# Patient Record
Sex: Male | Born: 1980 | Race: White | Hispanic: No | Marital: Single | Smoking: Never smoker
Health system: Southern US, Community
[De-identification: ages and names within clinical notes are randomized; demographics above are authoritative.]

## PROBLEM LIST (undated history)

## (undated) DIAGNOSIS — Z8709 Personal history of other diseases of the respiratory system: Secondary | ICD-10-CM

---

## 1998-08-09 ENCOUNTER — Emergency Department (HOSPITAL_COMMUNITY): Admission: EM | Admit: 1998-08-09 | Discharge: 1998-08-09 | Payer: Self-pay | Admitting: Emergency Medicine

## 1999-11-07 ENCOUNTER — Encounter: Payer: Self-pay | Admitting: Emergency Medicine

## 1999-11-07 ENCOUNTER — Emergency Department (HOSPITAL_COMMUNITY): Admission: EM | Admit: 1999-11-07 | Discharge: 1999-11-07 | Payer: Self-pay

## 1999-12-02 ENCOUNTER — Emergency Department (HOSPITAL_COMMUNITY): Admission: EM | Admit: 1999-12-02 | Discharge: 1999-12-02 | Payer: Self-pay

## 2004-07-09 ENCOUNTER — Encounter: Payer: Self-pay | Admitting: Surgery

## 2004-07-09 ENCOUNTER — Inpatient Hospital Stay (HOSPITAL_COMMUNITY): Admission: AD | Admit: 2004-07-09 | Discharge: 2004-07-12 | Payer: Self-pay | Admitting: Surgery

## 2004-07-22 ENCOUNTER — Encounter: Admission: RE | Admit: 2004-07-22 | Discharge: 2004-07-22 | Payer: Self-pay | Admitting: Surgery

## 2004-08-05 ENCOUNTER — Encounter: Admission: RE | Admit: 2004-08-05 | Discharge: 2004-08-05 | Payer: Self-pay | Admitting: Surgery

## 2014-12-12 ENCOUNTER — Encounter (HOSPITAL_COMMUNITY): Admission: EM | Disposition: A | Discharge status: Home Health | Payer: Self-pay | Source: Home / Self Care

## 2014-12-12 ENCOUNTER — Emergency Department (HOSPITAL_COMMUNITY): Payer: Self-pay | Admitting: Anesthesiology

## 2014-12-12 ENCOUNTER — Inpatient Hospital Stay (HOSPITAL_COMMUNITY)
Admission: EM | Admit: 2014-12-12 | Discharge: 2014-12-19 | DRG: 339 | Disposition: A | Discharge status: Home Health | Payer: Self-pay | Attending: General Surgery | Admitting: General Surgery

## 2014-12-12 ENCOUNTER — Encounter (HOSPITAL_COMMUNITY): Payer: Self-pay | Admitting: Physical Medicine and Rehabilitation

## 2014-12-12 ENCOUNTER — Emergency Department (HOSPITAL_COMMUNITY): Payer: Self-pay

## 2014-12-12 DIAGNOSIS — N179 Acute kidney failure, unspecified: Secondary | ICD-10-CM | POA: Diagnosis not present

## 2014-12-12 DIAGNOSIS — K3532 Acute appendicitis with perforation and localized peritonitis, without abscess: Secondary | ICD-10-CM | POA: Diagnosis present

## 2014-12-12 DIAGNOSIS — K913 Postprocedural intestinal obstruction: Secondary | ICD-10-CM | POA: Diagnosis present

## 2014-12-12 DIAGNOSIS — R112 Nausea with vomiting, unspecified: Secondary | ICD-10-CM | POA: Diagnosis present

## 2014-12-12 DIAGNOSIS — N289 Disorder of kidney and ureter, unspecified: Secondary | ICD-10-CM

## 2014-12-12 DIAGNOSIS — E860 Dehydration: Secondary | ICD-10-CM | POA: Diagnosis present

## 2014-12-12 DIAGNOSIS — K352 Acute appendicitis with generalized peritonitis: Principal | ICD-10-CM | POA: Diagnosis present

## 2014-12-12 HISTORY — PX: LAPAROTOMY: SHX154

## 2014-12-12 HISTORY — DX: Personal history of other diseases of the respiratory system: Z87.09

## 2014-12-12 HISTORY — PX: LAPAROSCOPY: SHX197

## 2014-12-12 HISTORY — PX: APPENDECTOMY: SHX54

## 2014-12-12 LAB — CBC WITH DIFFERENTIAL/PLATELET
BASOS ABS: 0 10*3/uL (ref 0.0–0.1)
BASOS PCT: 0 % (ref 0–1)
EOS PCT: 0 % (ref 0–5)
Eosinophils Absolute: 0 10*3/uL (ref 0.0–0.7)
HEMATOCRIT: 42.6 % (ref 39.0–52.0)
Hemoglobin: 14.9 g/dL (ref 13.0–17.0)
Lymphocytes Relative: 6 % — ABNORMAL LOW (ref 12–46)
Lymphs Abs: 1 10*3/uL (ref 0.7–4.0)
MCH: 32.1 pg (ref 26.0–34.0)
MCHC: 35 g/dL (ref 30.0–36.0)
MCV: 91.8 fL (ref 78.0–100.0)
MONO ABS: 1.1 10*3/uL — AB (ref 0.1–1.0)
Monocytes Relative: 6 % (ref 3–12)
NEUTROS ABS: 14.7 10*3/uL — AB (ref 1.7–7.7)
Neutrophils Relative %: 88 % — ABNORMAL HIGH (ref 43–77)
PLATELETS: 238 10*3/uL (ref 150–400)
RBC: 4.64 MIL/uL (ref 4.22–5.81)
RDW: 12.4 % (ref 11.5–15.5)
WBC: 16.8 10*3/uL — ABNORMAL HIGH (ref 4.0–10.5)

## 2014-12-12 LAB — COMPREHENSIVE METABOLIC PANEL
ALK PHOS: 51 U/L (ref 38–126)
ALT: 12 U/L — AB (ref 17–63)
AST: 16 U/L (ref 15–41)
Albumin: 3.5 g/dL (ref 3.5–5.0)
Anion gap: 15 (ref 5–15)
BILIRUBIN TOTAL: 2.2 mg/dL — AB (ref 0.3–1.2)
BUN: 28 mg/dL — ABNORMAL HIGH (ref 6–20)
CHLORIDE: 92 mmol/L — AB (ref 101–111)
CO2: 23 mmol/L (ref 22–32)
CREATININE: 2.3 mg/dL — AB (ref 0.61–1.24)
Calcium: 9.2 mg/dL (ref 8.9–10.3)
GFR calc Af Amer: 41 mL/min — ABNORMAL LOW (ref >60)
GFR, EST NON AFRICAN AMERICAN: 35 mL/min — AB (ref >60)
Glucose, Bld: 120 mg/dL — ABNORMAL HIGH (ref 65–99)
Potassium: 4 mmol/L (ref 3.5–5.1)
Sodium: 130 mmol/L — ABNORMAL LOW (ref 135–145)
Total Protein: 8.6 g/dL — ABNORMAL HIGH (ref 6.5–8.1)

## 2014-12-12 LAB — MRSA PCR SCREENING: MRSA BY PCR: NEGATIVE

## 2014-12-12 LAB — LIPASE, BLOOD: Lipase: 11 U/L — ABNORMAL LOW (ref 22–51)

## 2014-12-12 SURGERY — LAPAROSCOPY, DIAGNOSTIC
Anesthesia: General | Site: Abdomen

## 2014-12-12 MED ORDER — SODIUM CHLORIDE 0.9 % IV BOLUS (SEPSIS)
1000.0000 mL | Freq: Once | INTRAVENOUS | Status: AC
  Administered 2014-12-12: 1000 mL via INTRAVENOUS

## 2014-12-12 MED ORDER — ACETAMINOPHEN 10 MG/ML IV SOLN
INTRAVENOUS | Status: AC
  Filled 2014-12-12: qty 100

## 2014-12-12 MED ORDER — FENTANYL CITRATE (PF) 250 MCG/5ML IJ SOLN
INTRAMUSCULAR | Status: AC
  Filled 2014-12-12: qty 5

## 2014-12-12 MED ORDER — PROMETHAZINE HCL 25 MG/ML IJ SOLN: 6.2500 mg | INTRAMUSCULAR | Status: DC | PRN

## 2014-12-12 MED ORDER — NALOXONE HCL 0.4 MG/ML IJ SOLN: 0.4000 mg | INTRAMUSCULAR | Status: DC | PRN

## 2014-12-12 MED ORDER — SODIUM CHLORIDE 0.9 % IV SOLN
INTRAVENOUS | Status: DC | PRN
Start: 2014-12-12 — End: 2014-12-12
  Administered 2014-12-12: 16:00:00 via INTRAVENOUS

## 2014-12-12 MED ORDER — BUPIVACAINE HCL 0.25 % IJ SOLN
INTRAMUSCULAR | Status: DC | PRN
  Administered 2014-12-12: 3.5 mL

## 2014-12-12 MED ORDER — NEOSTIGMINE METHYLSULFATE 10 MG/10ML IV SOLN
INTRAVENOUS | Status: DC | PRN
  Administered 2014-12-12: 5 mg via INTRAVENOUS

## 2014-12-12 MED ORDER — IOHEXOL 300 MG/ML  SOLN
25.0000 mL | Freq: Once | INTRAMUSCULAR | Status: AC | PRN
  Administered 2014-12-12: 25 mL via ORAL

## 2014-12-12 MED ORDER — 0.9 % SODIUM CHLORIDE (POUR BTL) OPTIME
TOPICAL | Status: DC | PRN
  Administered 2014-12-12 (×5): 1000 mL

## 2014-12-12 MED ORDER — ACETAMINOPHEN 10 MG/ML IV SOLN
INTRAVENOUS | Status: DC | PRN
  Administered 2014-12-12: 1000 mg via INTRAVENOUS

## 2014-12-12 MED ORDER — NEOSTIGMINE METHYLSULFATE 10 MG/10ML IV SOLN
INTRAVENOUS | Status: AC
  Filled 2014-12-12: qty 1

## 2014-12-12 MED ORDER — ONDANSETRON HCL 4 MG/2ML IJ SOLN
4.0000 mg | Freq: Once | INTRAMUSCULAR | Status: AC
  Administered 2014-12-12: 4 mg via INTRAVENOUS
  Filled 2014-12-12: qty 2

## 2014-12-12 MED ORDER — LACTATED RINGERS IV SOLN
INTRAVENOUS | Status: DC
  Administered 2014-12-12: 50 mL/h via INTRAVENOUS

## 2014-12-12 MED ORDER — MORPHINE SULFATE 4 MG/ML IJ SOLN
4.0000 mg | Freq: Once | INTRAMUSCULAR | Status: AC
  Administered 2014-12-12: 4 mg via INTRAVENOUS
  Filled 2014-12-12: qty 1

## 2014-12-12 MED ORDER — SUCCINYLCHOLINE CHLORIDE 20 MG/ML IJ SOLN
INTRAMUSCULAR | Status: DC | PRN
  Administered 2014-12-12: 40 mg via INTRAVENOUS
  Administered 2014-12-12: 120 mg via INTRAVENOUS

## 2014-12-12 MED ORDER — GLYCOPYRROLATE 0.2 MG/ML IJ SOLN
INTRAMUSCULAR | Status: DC | PRN
  Administered 2014-12-12: 1 mg via INTRAVENOUS

## 2014-12-12 MED ORDER — ONDANSETRON HCL 4 MG/2ML IJ SOLN
INTRAMUSCULAR | Status: DC | PRN
  Administered 2014-12-12: 4 mg via INTRAVENOUS

## 2014-12-12 MED ORDER — LIDOCAINE HCL (CARDIAC) 20 MG/ML IV SOLN
INTRAVENOUS | Status: DC | PRN
  Administered 2014-12-12: 100 mg via INTRAVENOUS

## 2014-12-12 MED ORDER — FENTANYL CITRATE (PF) 100 MCG/2ML IJ SOLN
INTRAMUSCULAR | Status: DC | PRN
  Administered 2014-12-12: 50 ug via INTRAVENOUS
  Administered 2014-12-12: 150 ug via INTRAVENOUS
  Administered 2014-12-12 (×2): 50 ug via INTRAVENOUS

## 2014-12-12 MED ORDER — MIDAZOLAM HCL 2 MG/2ML IJ SOLN
INTRAMUSCULAR | Status: AC
  Filled 2014-12-12: qty 2

## 2014-12-12 MED ORDER — ONDANSETRON HCL 4 MG/2ML IJ SOLN: 4.0000 mg | Freq: Four times a day (QID) | INTRAMUSCULAR | Status: DC | PRN

## 2014-12-12 MED ORDER — PANTOPRAZOLE SODIUM 40 MG IV SOLR
40.0000 mg | INTRAVENOUS | Status: DC
Start: 2014-12-12 — End: 2014-12-15
  Administered 2014-12-12 – 2014-12-14 (×3): 40 mg via INTRAVENOUS
  Filled 2014-12-12 (×3): qty 40

## 2014-12-12 MED ORDER — IOHEXOL 300 MG/ML  SOLN
100.0000 mL | Freq: Once | INTRAMUSCULAR | Status: AC | PRN
  Administered 2014-12-12: 100 mL via INTRAVENOUS

## 2014-12-12 MED ORDER — MIDAZOLAM HCL 5 MG/5ML IJ SOLN
INTRAMUSCULAR | Status: DC | PRN
  Administered 2014-12-12: 2 mg via INTRAVENOUS

## 2014-12-12 MED ORDER — OXYCODONE HCL 5 MG PO TABS: 5.0000 mg | ORAL_TABLET | Freq: Once | ORAL | Status: DC | PRN

## 2014-12-12 MED ORDER — HYDROMORPHONE 0.3 MG/ML IV SOLN
INTRAVENOUS | Status: DC
  Administered 2014-12-12: 1.2 mg via INTRAVENOUS
  Administered 2014-12-13: 0.9 mg via INTRAVENOUS
  Administered 2014-12-13: 0.6 mg via INTRAVENOUS
  Administered 2014-12-13: 0.3 mg via INTRAVENOUS
  Administered 2014-12-13: 1.06 mg via INTRAVENOUS
  Administered 2014-12-13: 1.5 mg via INTRAVENOUS
  Administered 2014-12-13: 0.6 mg via INTRAVENOUS
  Administered 2014-12-13: 2.7 mg via INTRAVENOUS
  Administered 2014-12-14: 2.1 mg via INTRAVENOUS
  Administered 2014-12-14: 0.78 mg via INTRAVENOUS
  Administered 2014-12-14 (×2): 0.6 mg via INTRAVENOUS
  Administered 2014-12-14: 1.8 mg via INTRAVENOUS
  Administered 2014-12-15 (×2): 0.6 mg via INTRAVENOUS
  Administered 2014-12-15: 0.3 mg via INTRAVENOUS
  Administered 2014-12-16 (×2): 0.6 mg via INTRAVENOUS
  Administered 2014-12-16: 0.9 mg via INTRAVENOUS
  Administered 2014-12-16: 0.6 mg via INTRAVENOUS
  Administered 2014-12-16 – 2014-12-17 (×2): 0.9 mg via INTRAVENOUS
  Administered 2014-12-17: 2.1 mg via INTRAVENOUS
  Administered 2014-12-17 – 2014-12-18 (×2): 0.9 mg via INTRAVENOUS
  Filled 2014-12-12 (×3): qty 25

## 2014-12-12 MED ORDER — DIPHENHYDRAMINE HCL 50 MG/ML IJ SOLN: 12.5000 mg | Freq: Four times a day (QID) | INTRAMUSCULAR | Status: DC | PRN

## 2014-12-12 MED ORDER — HYDROMORPHONE 0.3 MG/ML IV SOLN
INTRAVENOUS | Status: AC
  Administered 2014-12-12: 17:00:00
  Filled 2014-12-12: qty 25

## 2014-12-12 MED ORDER — SODIUM CHLORIDE 0.9 % IJ SOLN: 9.0000 mL | INTRAMUSCULAR | Status: DC | PRN

## 2014-12-12 MED ORDER — ENOXAPARIN SODIUM 40 MG/0.4ML ~~LOC~~ SOLN
40.0000 mg | SUBCUTANEOUS | Status: DC
  Administered 2014-12-13 – 2014-12-18 (×6): 40 mg via SUBCUTANEOUS
  Filled 2014-12-12 (×7): qty 0.4

## 2014-12-12 MED ORDER — LACTATED RINGERS IV SOLN
INTRAVENOUS | Status: DC | PRN
  Administered 2014-12-12: 15:00:00 via INTRAVENOUS

## 2014-12-12 MED ORDER — ONDANSETRON HCL 4 MG/2ML IJ SOLN
4.0000 mg | Freq: Four times a day (QID) | INTRAMUSCULAR | Status: DC | PRN
  Administered 2014-12-13 – 2014-12-16 (×5): 4 mg via INTRAVENOUS
  Filled 2014-12-12 (×6): qty 2

## 2014-12-12 MED ORDER — ONDANSETRON HCL 4 MG PO TABS: 4.0000 mg | ORAL_TABLET | Freq: Four times a day (QID) | ORAL | Status: DC | PRN

## 2014-12-12 MED ORDER — GLYCOPYRROLATE 0.2 MG/ML IJ SOLN
INTRAMUSCULAR | Status: AC
  Filled 2014-12-12: qty 5

## 2014-12-12 MED ORDER — ACETAMINOPHEN 500 MG PO TABS
1000.0000 mg | ORAL_TABLET | Freq: Four times a day (QID) | ORAL | Status: AC
  Filled 2014-12-12: qty 2

## 2014-12-12 MED ORDER — SODIUM CHLORIDE 0.9 % IR SOLN
Status: DC | PRN
  Administered 2014-12-12: 1000 mL

## 2014-12-12 MED ORDER — PIPERACILLIN-TAZOBACTAM 3.375 G IVPB
3.3750 g | Freq: Three times a day (TID) | INTRAVENOUS | Status: DC
  Administered 2014-12-12 – 2014-12-18 (×17): 3.375 g via INTRAVENOUS
  Filled 2014-12-12 (×20): qty 50

## 2014-12-12 MED ORDER — KCL IN DEXTROSE-NACL 20-5-0.45 MEQ/L-%-% IV SOLN
INTRAVENOUS | Status: DC
  Administered 2014-12-12 – 2014-12-18 (×9): via INTRAVENOUS
  Filled 2014-12-12 (×18): qty 1000

## 2014-12-12 MED ORDER — HYDROMORPHONE HCL 1 MG/ML IJ SOLN
INTRAMUSCULAR | Status: AC
  Filled 2014-12-12: qty 1

## 2014-12-12 MED ORDER — PIPERACILLIN-TAZOBACTAM 3.375 G IVPB
3.3750 g | Freq: Once | INTRAVENOUS | Status: AC
  Administered 2014-12-12: 3.375 g via INTRAVENOUS
  Filled 2014-12-12: qty 50

## 2014-12-12 MED ORDER — PROPOFOL 10 MG/ML IV BOLUS
INTRAVENOUS | Status: AC
  Filled 2014-12-12: qty 20

## 2014-12-12 MED ORDER — BUPIVACAINE HCL (PF) 0.25 % IJ SOLN
INTRAMUSCULAR | Status: AC
  Filled 2014-12-12: qty 30

## 2014-12-12 MED ORDER — ROCURONIUM BROMIDE 100 MG/10ML IV SOLN
INTRAVENOUS | Status: DC | PRN
  Administered 2014-12-12: 40 mg via INTRAVENOUS
  Administered 2014-12-12: 50 mg via INTRAVENOUS

## 2014-12-12 MED ORDER — OXYCODONE HCL 5 MG/5ML PO SOLN: 5.0000 mg | Freq: Once | ORAL | Status: DC | PRN

## 2014-12-12 MED ORDER — HYDROMORPHONE HCL 1 MG/ML IJ SOLN: 0.2500 mg | INTRAMUSCULAR | Status: DC | PRN

## 2014-12-12 MED ORDER — DIPHENHYDRAMINE HCL 12.5 MG/5ML PO ELIX
12.5000 mg | ORAL_SOLUTION | Freq: Four times a day (QID) | ORAL | Status: DC | PRN
  Filled 2014-12-12: qty 5

## 2014-12-12 MED ORDER — PROPOFOL 10 MG/ML IV BOLUS
INTRAVENOUS | Status: DC | PRN
  Administered 2014-12-12: 200 mg via INTRAVENOUS

## 2014-12-12 SURGICAL SUPPLY — 59 items
APPLIER CLIP 5 13 M/L LIGAMAX5 (MISCELLANEOUS)
BENZOIN TINCTURE PRP APPL 2/3 (GAUZE/BANDAGES/DRESSINGS) ×3 IMPLANT
BLADE SURG 10 STRL SS (BLADE) ×3 IMPLANT
BLADE SURG ROTATE 9660 (MISCELLANEOUS) IMPLANT
BNDG GAUZE ELAST 4 BULKY (GAUZE/BANDAGES/DRESSINGS) ×3 IMPLANT
CANISTER SUCTION 2500CC (MISCELLANEOUS) ×3 IMPLANT
CHLORAPREP W/TINT 26ML (MISCELLANEOUS) ×3 IMPLANT
CLIP APPLIE 5 13 M/L LIGAMAX5 (MISCELLANEOUS) IMPLANT
COVER SURGICAL LIGHT HANDLE (MISCELLANEOUS) ×3 IMPLANT
COVER TRANSDUCER ULTRASND (DRAPES) ×3 IMPLANT
DEVICE TROCAR PUNCTURE CLOSURE (ENDOMECHANICALS) ×3 IMPLANT
DRAIN CHANNEL 19F RND (DRAIN) ×3 IMPLANT
DRSG PAD ABDOMINAL 8X10 ST (GAUZE/BANDAGES/DRESSINGS) ×3 IMPLANT
ELECT CAUTERY BLADE 6.4 (BLADE) ×3 IMPLANT
ELECT REM PT RETURN 9FT ADLT (ELECTROSURGICAL) ×3
ELECTRODE REM PT RTRN 9FT ADLT (ELECTROSURGICAL) ×2 IMPLANT
ENDOLOOP SUT PDS II  0 18 (SUTURE) ×3
ENDOLOOP SUT PDS II 0 18 (SUTURE) ×6 IMPLANT
EVACUATOR SILICONE 100CC (DRAIN) ×3 IMPLANT
GLOVE BIO SURGEON STRL SZ7.5 (GLOVE) ×9 IMPLANT
GLOVE BIOGEL PI IND STRL 7.0 (GLOVE) ×6 IMPLANT
GLOVE BIOGEL PI INDICATOR 7.0 (GLOVE) ×3
GLOVE SURG SS PI 7.0 STRL IVOR (GLOVE) ×9 IMPLANT
GOWN STRL REUS W/ TWL LRG LVL3 (GOWN DISPOSABLE) ×4 IMPLANT
GOWN STRL REUS W/ TWL XL LVL3 (GOWN DISPOSABLE) ×2 IMPLANT
GOWN STRL REUS W/TWL LRG LVL3 (GOWN DISPOSABLE) ×2
GOWN STRL REUS W/TWL XL LVL3 (GOWN DISPOSABLE) ×1
KIT BASIN OR (CUSTOM PROCEDURE TRAY) ×3 IMPLANT
KIT ROOM TURNOVER OR (KITS) ×3 IMPLANT
NEEDLE INSUFFLATION 14GA 120MM (NEEDLE) ×3 IMPLANT
NS IRRIG 1000ML POUR BTL (IV SOLUTION) ×15 IMPLANT
PAD ABD 8X10 STRL (GAUZE/BANDAGES/DRESSINGS) ×3 IMPLANT
PAD ARMBOARD 7.5X6 YLW CONV (MISCELLANEOUS) ×6 IMPLANT
PENCIL BUTTON HOLSTER BLD 10FT (ELECTRODE) ×3 IMPLANT
SCISSORS LAP 5X35 DISP (ENDOMECHANICALS) ×3 IMPLANT
SET IRRIG TUBING LAPAROSCOPIC (IRRIGATION / IRRIGATOR) ×3 IMPLANT
SLEEVE ENDOPATH XCEL 5M (ENDOMECHANICALS) ×3 IMPLANT
SPECIMEN JAR SMALL (MISCELLANEOUS) ×3 IMPLANT
SPONGE GAUZE 4X4 12PLY STER LF (GAUZE/BANDAGES/DRESSINGS) ×3 IMPLANT
SPONGE LAP 18X18 X RAY DECT (DISPOSABLE) ×3 IMPLANT
STAPLER VISISTAT 35W (STAPLE) ×3 IMPLANT
SUCTION POOLE TIP (SUCTIONS) ×3 IMPLANT
SUT ETHILON 2 0 FS 18 (SUTURE) ×3 IMPLANT
SUT MNCRL AB 3-0 PS2 18 (SUTURE) ×3 IMPLANT
SUT PDS AB 1 TP1 54 (SUTURE) ×6 IMPLANT
SUT SILK 2 0 (SUTURE) ×1
SUT SILK 2 0 SH (SUTURE) ×3 IMPLANT
SUT SILK 2-0 18XBRD TIE 12 (SUTURE) ×2 IMPLANT
SUT VICRYL AB 2 0 TIES (SUTURE) ×3 IMPLANT
TAPE CLOTH SURG 6X10 WHT LF (GAUZE/BANDAGES/DRESSINGS) ×3 IMPLANT
TOWEL OR 17X24 6PK STRL BLUE (TOWEL DISPOSABLE) ×3 IMPLANT
TOWEL OR 17X26 10 PK STRL BLUE (TOWEL DISPOSABLE) IMPLANT
TRAY FOLEY CATH 16FR SILVER (SET/KITS/TRAYS/PACK) ×3 IMPLANT
TRAY LAPAROSCOPIC MC (CUSTOM PROCEDURE TRAY) ×3 IMPLANT
TROCAR XCEL NON-BLD 11X100MML (ENDOMECHANICALS) ×3 IMPLANT
TROCAR XCEL NON-BLD 5MMX100MML (ENDOMECHANICALS) ×3 IMPLANT
TUBE CONNECTING 20X1/4 (TUBING) ×3 IMPLANT
TUBING INSUFFLATION (TUBING) ×3 IMPLANT
YANKAUER SUCT BULB TIP NO VENT (SUCTIONS) ×3 IMPLANT

## 2014-12-12 NOTE — Anesthesia Procedure Notes (Signed)
Procedure Name: Intubation Date/Time: 12/12/2014 3:19 PM Performed by: Carmela RimaMARTINELLI, Frank Davis Pre-anesthesia Checklist: Patient being monitored, Suction available, Emergency Drugs available, Timeout performed and Patient identified Patient Re-evaluated:Patient Re-evaluated prior to inductionOxygen Delivery Method: Circle system utilized Preoxygenation: Pre-oxygenation with 100% oxygen Intubation Type: IV induction, Rapid sequence and Cricoid Pressure applied Laryngoscope Size: Mac and 4 Grade View: Grade III Tube type: Oral Tube size: 7.5 mm Number of attempts: 1 Placement Confirmation: positive ETCO2,  ETT inserted through vocal cords under direct vision and breath sounds checked- equal and bilateral Secured at: 24 cm Tube secured with: Tape Dental Injury: Teeth and Oropharynx as per pre-operative assessment  Difficulty Due To: Difficulty was unanticipated

## 2014-12-12 NOTE — ED Notes (Signed)
Pt presents to department for evaluation of generalized abdominal pain, nausea, vomiting and diarrhea. Ongoing since Saturday. 10/10 pain upon arrival to ED.

## 2014-12-12 NOTE — Anesthesia Preprocedure Evaluation (Addendum)
Anesthesia Evaluation  Patient identified by MRN, date of birth, ID band Patient awake    Reviewed: Allergy & Precautions, NPO status , Patient's Chart, lab work & pertinent test results  Airway Mallampati: III  TM Distance: >3 FB Neck ROM: full  Mouth opening: Limited Mouth Opening  Dental  (+) Teeth Intact, Dental Advidsory Given   Pulmonary neg pulmonary ROS,  breath sounds clear to auscultation        Cardiovascular negative cardio ROS  Rhythm:regular Rate:Normal     Neuro/Psych negative neurological ROS     GI/Hepatic Neg liver ROS, Acute perforated appendicitis   Endo/Other  negative endocrine ROS  Renal/GU ARFRenal disease     Musculoskeletal   Abdominal   Peds  Hematology negative hematology ROS (+)   Anesthesia Other Findings   Reproductive/Obstetrics                           Anesthesia Physical Anesthesia Plan  ASA: II and emergent  Anesthesia Plan: General   Post-op Pain Management:    Induction: Intravenous  Airway Management Planned: Oral ETT  Additional Equipment:   Intra-op Plan:   Post-operative Plan: Extubation in OR  Informed Consent: I have reviewed the patients History and Physical, chart, labs and discussed the procedure including the risks, benefits and alternatives for the proposed anesthesia with the patient or authorized representative who has indicated his/her understanding and acceptance.   Dental advisory given and Dental Advisory Given  Plan Discussed with: CRNA, Anesthesiologist and Surgeon  Anesthesia Plan Comments:        Anesthesia Quick Evaluation

## 2014-12-12 NOTE — H&P (Signed)
Frank Davis 24-Aug-1980  [id number].   Chief Complaint/Reason for Consult: abdominal pain HPI: This is a pleasant otherwise healthy 34 yo white male who began having abdominal pain about 4 days ago.  He's been trying to tough it out at home, but his pain has persisted and continued to worsen.  He has had nausea/vomiting/diarrhea at home.  He has had subjective fevers as well.  He denies chest pain, but admits to difficulty breathing secondary to abdominal pain.  He states that his urine has been darker and he has not been voiding as much as usual.  He finally came to the [facility] today where he had a CT scan that revealed acute perforated appendicitis with multiple intra-abdominal fluid collections and a small amount of pneumoperitoneum, focally, in the RLQ.  His WBC is 16.8K, Cr 2.30, and pulse 120-130s.  We have been asked to see the patient for admission.  ROS : Please see HPI, otherwise negative  History reviewed. No pertinent family history.  Past Medical History  Diagnosis Date  . H/O pneumothorax     History reviewed. No pertinent past surgical history.  Social History:  reports that he has never smoked. He does not have any smokeless tobacco history on file. He reports that he drinks alcohol. He reports that he does not use illicit drugs.  Allergies: No Known Allergies   (Not in a hospital admission)  Blood pressure 118/74, pulse 136, temperature 101.2 F (38.4 C), temperature source Oral, resp. rate 18, height 6' 1"  (1.854 m), weight 95.255 kg (210 lb), SpO2 97 %. Physical Exam: General: pleasant, WD, WN white male who is laying in bed in NAD HEENT: head is normocephalic, atraumatic.  Sclera are noninjected.  PERRL.  Ears and nose without any masses or lesions.  Mouth is pink, but dry Heart: regular rhythm, but tachycardic.  Normal s1,s2. No obvious murmurs, gallops, or rubs noted.  Palpable radial and pedal pulses bilaterally Lungs: CTAB, no wheezes, rhonchi, or rales noted.   Respiratory effort nonlabored Abd: some distention, diffuse tenderness with guarding and peritoneal signs throughout, but greatest in RLQ, hypoactive BS, no masses, hernias, or organomegaly MS: all 4 extremities are symmetrical with no cyanosis, clubbing, or edema. Skin: warm, but clammy. no masses, lesions, or rashes Psych: A&Ox3 with an appropriate affect.    Results for orders placed or performed during the hospital encounter of 12/12/14 (from the past 48 hour(s))  CBC with Differential     Status: Abnormal   Collection Time: 12/12/14 12:50 PM  Result Value Ref Range   WBC 16.8 (H) 4.0 - 10.5 K/uL   RBC 4.64 4.22 - 5.81 MIL/uL   Hemoglobin 14.9 13.0 - 17.0 g/dL   HCT 42.6 39.0 - 52.0 %   MCV 91.8 78.0 - 100.0 fL   MCH 32.1 26.0 - 34.0 pg   MCHC 35.0 30.0 - 36.0 g/dL   RDW 12.4 11.5 - 15.5 %   Platelets 238 150 - 400 K/uL   Neutrophils Relative % 88 (H) 43 - 77 %   Neutro Abs 14.7 (H) 1.7 - 7.7 K/uL   Lymphocytes Relative 6 (L) 12 - 46 %   Lymphs Abs 1.0 0.7 - 4.0 K/uL   Monocytes Relative 6 3 - 12 %   Monocytes Absolute 1.1 (H) 0.1 - 1.0 K/uL   Eosinophils Relative 0 0 - 5 %   Eosinophils Absolute 0.0 0.0 - 0.7 K/uL   Basophils Relative 0 0 - 1 %   Basophils Absolute  0.0 0.0 - 0.1 K/uL  Comprehensive metabolic panel     Status: Abnormal   Collection Time: 12/12/14 12:50 PM  Result Value Ref Range   Sodium 130 (L) 135 - 145 mmol/L   Potassium 4.0 3.5 - 5.1 mmol/L   Chloride 92 (L) 101 - 111 mmol/L   CO2 23 22 - 32 mmol/L   Glucose, Bld 120 (H) 65 - 99 mg/dL   BUN 28 (H) 6 - 20 mg/dL   Creatinine, Ser 2.30 (H) 0.61 - 1.24 mg/dL   Calcium 9.2 8.9 - 10.3 mg/dL   Total Protein 8.6 (H) 6.5 - 8.1 g/dL   Albumin 3.5 3.5 - 5.0 g/dL   AST 16 15 - 41 U/L   ALT 12 (L) 17 - 63 U/L   Alkaline Phosphatase 51 38 - 126 U/L   Total Bilirubin 2.2 (H) 0.3 - 1.2 mg/dL   GFR calc non Af Amer 35 (L) >60 mL/min   GFR calc Af Amer 41 (L) >60 mL/min    Comment: (NOTE) The eGFR has been  calculated using the CKD EPI equation. This calculation has not been validated in all clinical situations. eGFR's persistently <60 mL/min signify possible Chronic Kidney Disease.    Anion gap 15 5 - 15  Lipase, blood     Status: Abnormal   Collection Time: 12/12/14 12:50 PM  Result Value Ref Range   Lipase 11 (L) 22 - 51 U/L   Ct Abdomen Pelvis W Contrast  12/12/2014   CLINICAL DATA:  Generalized abdominal pain for 4 days with nausea, vomiting, diarrhea, and fever.  EXAM: CT ABDOMEN AND PELVIS WITH CONTRAST  TECHNIQUE: Multidetector CT imaging of the abdomen and pelvis was performed using the standard protocol following bolus administration of intravenous contrast.  CONTRAST:  157m OMNIPAQUE IOHEXOL 300 MG/ML  SOLN  COMPARISON:  None.  FINDINGS: Motion artifact and subsegmental atelectasis are noted in the lung bases. Oral contrast is present in the visualized distal thoracic esophagus and may represent esophageal dysmotility or gastroesophageal reflux.  No focal liver lesion is identified. The gallbladder, spleen, adrenal glands, right kidney, and pancreas are unremarkable. There is a 10 mm soft tissue density lesion projecting posteriorly from the lower pole of the left kidney, too small to fully characterize.  There is prominent inflammatory stranding and a small amount of free fluid in the right lower quadrant and pelvis. The appendix is located centrally within this region of inflammation and is dilated, measuring 12 mm in diameter, and demonstrates a hyperenhancing wall. There is a small amount of intraperitoneal free air in the lower abdomen and pelvis, the vast majority of which is along the superior aspect of the appendix.  There is moderate wall thickening and hyper enhancement involving the distal ileum with a 3.2 x 2.3 cm gas and fluid collection in the pelvis abutting the inferior aspect of the distal ileum (series 2, image 90 and series 6, image 85). A smaller loculated gas and fluid  collection in the anterior left pelvis measures up to 9 mm in thickness and extends over a length of approximately 5 cm (series 2, image 90). A loculated rim enhancing fluid collection more posteriorly in the pelvis located between the bladder and rectum measures 3.2 x 2.1 cm (series 2, image 98). There is mild dilatation of multiple small bowel loops up to 3.6 cm in diameter. The colon is decompressed. There is mild wall thickening of the sigmoid colon.  Small right mid and lower abdominal mesenteric lymph  nodes measure up to 6 mm in short axis and are likely reactive. Bladder is nondistended and appears mildly thick-walled, likely related to adjacent inflammation. No acute osseous abnormality is identified.  IMPRESSION: 1. Extensive inflammation in the right lower quadrant and pelvis with small volume pneumoperitoneum. This is favored to represent perforated appendicitis with secondary inflammatory changes involving the distal ileum and sigmoid colon rather than a primary small or large bowel process. 2. 3 small loculated fluid collections in the pelvis, concerning for developing abscesses. 3. Mild small bowel dilatation likely reflecting ileus. 4. 10 mm indeterminate left renal lesion. Further evaluation with abdominal MRI (without and with IV contrast) is recommended on an outpatient basis after the patient's acute illness has resolved. Critical Value/emergent results were called by telephone at the time of interpretation on 12/12/2014 at 1:55 pm to Dr. Veryl Speak , who verbally acknowledged these results.   Electronically Signed   By: Logan Bores   On: 12/12/2014 13:59       Assessment/Plan 1. Acute perforated appendicitis with multiple small intra-abdominal fluid collections -admit, aggressive IVF resuscitation -IV zosyn -to OR for laparoscopic possible open appendectomy -patient already has an ileus from his CT scan and this will likely worsen after surgery.  We have discussed the surgery,  including the procedure, risks, complications, possible outcomes, etc with the patient and his sister who is present with him.  He understands and is agreeable to proceed. 2. ARI -likely secondary to dehydration from N/V/D -aggressive IVF resuscitation and trend labs  Ahava Kissoon E 12/12/2014, 2:34 PM Pager: [phone]

## 2014-12-12 NOTE — Transfer of Care (Signed)
Immediate Anesthesia Transfer of Care Note  Patient: Frank Davis  Procedure(s) Performed: Procedure(s): LAPAROSCOPY DIAGNOSTIC (N/A) EXPLORATORY LAPAROTOMY (N/A) APPENDECTOMY (N/A)  Patient Location: PACU  Anesthesia Type:General  Level of Consciousness: awake, oriented, sedated, patient cooperative and responds to stimulation  Airway & Oxygen Therapy: Patient Spontanous Breathing and Patient connected to nasal cannula oxygen  Post-op Assessment: Report given to RN, Post -op Vital signs reviewed and stable, Patient moving all extremities and Patient moving all extremities X 4  Post vital signs: Reviewed and stable  Last Vitals:  Filed Vitals:   12/12/14 1215  BP:   Pulse:   Temp: 38.4 C  Resp:     Complications: No apparent anesthesia complications

## 2014-12-12 NOTE — ED Provider Notes (Signed)
CSN: [id number]     Arrival date & time 12/12/14  1157 History   First MD Initiated Contact with Patient 12/12/14 1206     Chief Complaint  Patient presents with  . Abdominal Pain  . Emesis     (Consider location/radiation/quality/duration/timing/severity/associated sxs/prior Treatment) HPI Comments: Patient is a 34 year old male with no significant past medical history. He presents for evaluation of generalized abdominal pain that started 4 days ago. It has gradually worsened. He reports to me he is having nausea and vomiting but does not describe diarrhea. The pain has rapidly worsened.  Patient is a 34 y.o. male presenting with abdominal pain and vomiting. The history is provided by the patient.  Abdominal Pain Pain location:  Generalized Pain quality: cramping   Pain radiates to:  Does not radiate Pain severity:  Severe Onset quality:  Gradual Duration:  4 days Timing:  Constant Progression:  Worsening Chronicity:  New Context: not alcohol use   Relieved by:  Nothing Worsened by:  Movement, palpation and position changes Ineffective treatments:  None tried Associated symptoms: chills, fatigue, fever and vomiting   Associated symptoms: no constipation and no diarrhea   Emesis Associated symptoms: abdominal pain and chills   Associated symptoms: no diarrhea     History reviewed. No pertinent past medical history. History reviewed. No pertinent past surgical history. No family history on file. History  Substance Use Topics  . Smoking status: Never Smoker   . Smokeless tobacco: Not on file  . Alcohol Use: No    Review of Systems  Constitutional: Positive for fever, chills and fatigue.  Gastrointestinal: Positive for vomiting and abdominal pain. Negative for diarrhea and constipation.  All other systems reviewed and are negative.     Allergies  Review of patient's allergies indicates no known allergies.  Home Medications   Prior to Admission medications   Not  on File   BP 118/74 mmHg  Pulse 136  Temp(Src) 101.2 F (38.4 C) (Oral)  Resp 18  Ht 6\' 1"  (1.854 m)  Wt 210 lb (95.255 kg)  BMI 27.71 kg/m2  SpO2 97% Physical Exam  Constitutional: He is oriented to person, place, and time. He appears well-developed and well-nourished. No distress.  HENT:  Head: Normocephalic and atraumatic.  Neck: Normal range of motion. Neck supple.  Cardiovascular: Normal rate, regular rhythm and normal heart sounds.   No murmur heard. Pulmonary/Chest: Effort normal and breath sounds normal. No respiratory distress. He has no wheezes.  Abdominal: Bowel sounds are normal. He exhibits no distension. There is tenderness.  The abdomen is somewhat firm. There is tenderness to palpation in all 4 quadrants. There is no rebound and no guarding.  Musculoskeletal: Normal range of motion. He exhibits no edema.  Neurological: He is alert and oriented to person, place, and time.  Skin: Skin is warm and dry. He is not diaphoretic.  Nursing note and vitals reviewed.   ED Course  Procedures (including critical care time) Labs Review Labs Reviewed  CBC WITH DIFFERENTIAL/PLATELET  COMPREHENSIVE METABOLIC PANEL  LIPASE, BLOOD  URINALYSIS, ROUTINE W REFLEX MICROSCOPIC (NOT AT [facility])    Imaging Review No results found.   EKG Interpretation None      MDM   Final diagnoses:  None    CT scan reveals perforated appendicitis with suspected abscess formation and ileus. Patient was given 2 L of normal saline and Zosyn has been ordered. I've spoken with Dr. Derrell Lollingamirez from general surgery who will take the patient to the operating  room for surgical repair.    Geoffery Lyons, MD 12/12/14 [phone]

## 2014-12-13 ENCOUNTER — Encounter (HOSPITAL_COMMUNITY): Payer: Self-pay | Admitting: General Surgery

## 2014-12-13 LAB — CBC
HCT: 37.8 % — ABNORMAL LOW (ref 39.0–52.0)
Hemoglobin: 13 g/dL (ref 13.0–17.0)
MCH: 31.6 pg (ref 26.0–34.0)
MCHC: 34.4 g/dL (ref 30.0–36.0)
MCV: 92 fL (ref 78.0–100.0)
PLATELETS: 245 10*3/uL (ref 150–400)
RBC: 4.11 MIL/uL — ABNORMAL LOW (ref 4.22–5.81)
RDW: 12.5 % (ref 11.5–15.5)
WBC: 12.6 10*3/uL — ABNORMAL HIGH (ref 4.0–10.5)

## 2014-12-13 LAB — BASIC METABOLIC PANEL
ANION GAP: 11 (ref 5–15)
BUN: 30 mg/dL — AB (ref 6–20)
CO2: 20 mmol/L — AB (ref 22–32)
Calcium: 8 mg/dL — ABNORMAL LOW (ref 8.9–10.3)
Chloride: 98 mmol/L — ABNORMAL LOW (ref 101–111)
Creatinine, Ser: 1.95 mg/dL — ABNORMAL HIGH (ref 0.61–1.24)
GFR calc non Af Amer: 43 mL/min — ABNORMAL LOW (ref >60)
GFR, EST AFRICAN AMERICAN: 50 mL/min — AB (ref >60)
Glucose, Bld: 135 mg/dL — ABNORMAL HIGH (ref 65–99)
Potassium: 4.2 mmol/L (ref 3.5–5.1)
Sodium: 129 mmol/L — ABNORMAL LOW (ref 135–145)

## 2014-12-13 NOTE — Progress Notes (Signed)
1 Day Post-Op  Subjective: Pt with no acute changes  Objective: Vital signs in last 24 hours: Temp:  [97.8 F (36.6 C)-101.2 F (38.4 C)] 98.8 F (37.1 C) (07/07 0315) Pulse Rate:  [114-136] 126 (07/07 0315) Resp:  [11-24] 24 (07/07 0315) BP: (108-136)/(59-81) 108/62 mmHg (07/07 0315) SpO2:  [95 %-98 %] 95 % (07/07 0315) Weight:  [91.1 kg (200 lb 13.4 oz)-95.255 kg (210 lb)] 91.1 kg (200 lb 13.4 oz) (07/06 1904) Last BM Date: 12/11/14  Intake/Output from previous day: 07/06 0701 - 07/07 0700 In: 2574.6 [I.V.:2514.6; NG/GT:60] Out: 1375 [Urine:775; Emesis/NG output:250; Drains:50; Blood:50] Intake/Output this shift:    General appearance: alert and cooperative Cardio: tachy, RR GI: soft, wound packed, hypoactive BS  Lab Results:   Recent Labs  12/12/14 1250 12/13/14 0210  WBC 16.8* 12.6*  HGB 14.9 13.0  HCT 42.6 37.8*  PLT 238 245   BMET  Recent Labs  12/12/14 1250 12/13/14 0210  NA 130* 129*  K 4.0 4.2  CL 92* 98*  CO2 23 20*  GLUCOSE 120* 135*  BUN 28* 30*  CREATININE 2.30* 1.95*  CALCIUM 9.2 8.0*   PT/INR No results for input(s): LABPROT, INR in the last 72 hours. ABG No results for input(s): PHART, HCO3 in the last 72 hours.  Invalid input(s): PCO2, PO2  Studies/Results: Ct Abdomen Pelvis W Contrast  12/12/2014   CLINICAL DATA:  Generalized abdominal pain for 4 days with nausea, vomiting, diarrhea, and fever.  EXAM: CT ABDOMEN AND PELVIS WITH CONTRAST  TECHNIQUE: Multidetector CT imaging of the abdomen and pelvis was performed using the standard protocol following bolus administration of intravenous contrast.  CONTRAST:  OMNIPAQUE IOHEXOL 300 MG/ML  SOLN  COMPARISON:  None.  FINDINGS: Motion artifact and subsegmental atelectasis are noted in the lung bases. Oral contrast is present in the visualized distal thoracic esophagus and may represent esophageal dysmotility or gastroesophageal reflux.  No focal liver lesion is identified. The gallbladder,  spleen, adrenal glands, right kidney, and pancreas are unremarkable. There is a 10 mm soft tissue density lesion projecting posteriorly from the lower pole of the left kidney, too small to fully characterize.  There is prominent inflammatory stranding and a small amount of free fluid in the right lower quadrant and pelvis. The appendix is located centrally within this region of inflammation and is dilated, measuring 12 mm in diameter, and demonstrates a hyperenhancing wall. There is a small amount of intraperitoneal free air in the lower abdomen and pelvis, the vast majority of which is along the superior aspect of the appendix.  There is moderate wall thickening and hyper enhancement involving the distal ileum with a 3.2 x 2.3 cm gas and fluid collection in the pelvis abutting the inferior aspect of the distal ileum (series 2, image 90 and series 6, image 85). A smaller loculated gas and fluid collection in the anterior left pelvis measures up to 9 mm in thickness and extends over a length of approximately 5 cm (series 2, image 90). A loculated rim enhancing fluid collection more posteriorly in the pelvis located between the bladder and rectum measures 3.2 x 2.1 cm (series 2, image 98). There is mild dilatation of multiple small bowel loops up to 3.6 cm in diameter. The colon is decompressed. There is mild wall thickening of the sigmoid colon.  Small right mid and lower abdominal mesenteric lymph nodes measure up to 6 mm in short axis and are likely reactive. Bladder is nondistended and appears mildly thick-walled,  likely related to adjacent inflammation. No acute osseous abnormality is identified.  IMPRESSION: 1. Extensive inflammation in the right lower quadrant and pelvis with small volume pneumoperitoneum. This is favored to represent perforated appendicitis with secondary inflammatory changes involving the distal ileum and sigmoid colon rather than a primary small or large bowel process. 2. 3 small loculated  fluid collections in the pelvis, concerning for developing abscesses. 3. Mild small bowel dilatation likely reflecting ileus. 4. 10 mm indeterminate left renal lesion. Further evaluation with abdominal MRI (without and with IV contrast) is recommended on an outpatient basis after the patient's acute illness has resolved. Critical Value/emergent results were called by telephone at the time of interpretation on 12/12/2014 at 1:55 pm to Dr. Geoffery LyonsUGLAS DELO , who verbally acknowledged these results.   Electronically Signed   By: Sebastian AcheAllen  Grady   On: 12/12/2014 13:59    Anti-infectives: Anti-infectives    Start     Dose/Rate Route Frequency Ordered Stop   12/12/14 2200  piperacillin-tazobactam (ZOSYN) IVPB 3.375 g     3.375 g 12.5 mL/hr over 240 Minutes Intravenous Every 8 hours 12/12/14 1937     12/12/14 1415  piperacillin-tazobactam (ZOSYN) IVPB 3.375 g     3.375 g 12.5 mL/hr over 240 Minutes Intravenous  Once 12/12/14 1410 12/12/14 1819      Assessment/Plan: s/p Procedure(s): LAPAROSCOPY DIAGNOSTIC (N/A) EXPLORATORY LAPAROTOMY (N/A) APPENDECTOMY (N/A) POD #1-s/p Lap appy Zosyn d2/14 Change packing tomorrow Mobilize//PT WBC trending down- will monitor for possible abscess which is likely to develop Con't NGT, ileus likely to develop  DC foley in AM  LOS: 1 day    Marigene Ehlersamirez Jr., Jed LimerickArmando 12/13/2014

## 2014-12-13 NOTE — Anesthesia Postprocedure Evaluation (Signed)
  Anesthesia Post-op Note  Patient: Frank Davis  Procedure(s) Performed: Procedure(s): LAPAROSCOPY DIAGNOSTIC (N/A) EXPLORATORY LAPAROTOMY (N/A) APPENDECTOMY (N/A)  Patient Location: PACU  Anesthesia Type:General  Level of Consciousness: awake, alert  and sedated  Airway and Oxygen Therapy: Patient Spontanous Breathing  Post-op Pain: mild  Post-op Assessment: Post-op Vital signs reviewed LLE Motor Response: Purposeful movement   RLE Motor Response: Purposeful movement        Post-op Vital Signs: stable  Last Vitals:  Filed Vitals:   12/13/14 0826  BP: 138/83  Pulse: 126  Temp: 36.7 C  Resp: 18    Complications: No apparent anesthesia complications

## 2014-12-13 NOTE — Hospital Discharge Follow-Up (Signed)
Call received from Gae GallopAngela Cole, RN CM who indicated patient [facility]. Patient s/p appendectomy for acute perforated appendix. Patient uninsured and does not have a PCP. Appointment obtained on 12/19/14 at 1500 with Dr. Venetia NightAmao. Hospital follow-up appointment placed on AVS. CM updated.

## 2014-12-13 NOTE — Op Note (Signed)
12/12/2014  9:35 AM  PATIENT:  Frank Davis  34 y.o. male  PRE-OPERATIVE DIAGNOSIS:  ACUTE PERFORATED APPENDIX  POST-OPERATIVE DIAGNOSIS:  ACUTE PERFORATED APPENDIX  PROCEDURE:  Procedure(s): LAPAROSCOPY DIAGNOSTIC (N/A) EXPLORATORY LAPAROTOMY (N/A) APPENDECTOMY (N/A)  SURGEON:  Surgeon(s) and Role:    * Axel Filler, MD - Primary  ANESTHESIA:   local and general  EBL:   50cc  BLOOD ADMINISTERED:none  DRAINS: (19FR) Jackson-Pratt drain(s) with closed bulb suction in the pelvis  LOCAL MEDICATIONS USED:  BUPIVICAINE   SPECIMEN:  Source of Specimen:  appendix  DISPOSITION OF SPECIMEN:  PATHOLOGY  COUNTS:  YES  TOURNIQUET:  * No tourniquets in log *  DICTATION: .Dragon Dictation Complications: none  Counts: reported as correct x 2  Findings:  The patient had dilated loops of small bowel, multiple fluid collections within the bowel loops, inflamed terminal ileum, perforated purulent, necrotic appendicitis.  Specimen: Appendix  Indications for procedure:  The patient is a 34 year old male with a history of generalized abdominal pain for 4 days. CT scan which revealed signs consistent with perforated appendicitis the patient back in for laparoscopic versus open appendectomy.  Details of the procedure:  The patient was taken back to the operating room. The patient was placed in supine position with bilateral SCDs in place. The patient was prepped and draped in the usual sterile fashion.  After appropriate anitbiotics were confirmed, a time-out was confirmed and all facts were verified.  A pneumoperitoneum of 14 mmHg was obtained via a Veress needle technique in the left lower quadrant quadrant.  There appeared to be a large amount of small bowel adhesions to the anterior abdominal wall. Subsequent to this a 5 mm trocar was then placed in the right subcostal margin. Bilateral camera in this trocar there is apparently a large amount of small bowel distention and turbid fluid  collection that was causing small bowel U Spencer abdominal wall. At this time a 10 mm trochars placed in the midline just superior to the umbilicus. A Lowsley retractor was advanced in this area and direct visualization. At this time I proceeded to gently take down the small bowel adhesions from the anterior abdominal wall. There was a large amount of fluid pockets ever seen in between the small bowel small bowel adhesions. In the pelvis and in the right lower quadrant and it appeared to be a ruptured appendix with purulence.   Secondary to the limited amount proceeded to abandon the laparoscopic approach and proceeded with a lower midline laparotomy incision. This was done with a 10 blade, and electrocautery to maintain hemostasis. Dissection was taken down to the anterior fascia. This was then incised with electrocautery the peritoneum was bluntly entered. The fascia was then extended to the skin incision. The skin incision was extended above the umbilicus. At this time emergently retractor was then placed into the right lower quadrant. The cecum was seen to be attached to the retroperitoneum. The white line of Toldt was then incised proximally to help mobilize the cecum medially. At this time a Bookwalter retractor was then placed and retractors placed to help with retraction.  The small bowel small bowel loops were gently finger fractured. The small bowel did appear inflamed and thickened near the terminal ileum. Once the cecum and the appendix were able to be medialized and exposed appropriately the mesoappendix was ligated with a 2-0 silk. This was then transected with electrocautery. At this time a Kelly clamp was placed at the base the appendix. The appendix  was amputated. A 2-0 silk was then used to ligate the base the appendix. The appendiceal stump was imbricated into the cecum a 2-0 silk was used in a figure-of-eight fashion to imbricate the appendiceal stump.  At this time we proceeded to  irrigate out the abdomen with 6 L of sterile saline. All fluid pockets were broken up. A 19 [place] Blake drain was then placed in the right lower quadrant and placed down into the pelvis. This brought out through a stab incision in the right lower quadrant. This was secured to the anterior abdominal wall using a 2-0 nylon. At this time the midline fascia with interrupted #1 PDS in a running standard fashion. The skin was left open and packed with Betadine soaked Kerlix. The trocar sites in the left lower quadrant and right upper quadrant were approximated with skin staples.  The patient how the procedure well was taken to the recovery room stable condition.   PLAN OF CARE: Admit to inpatient   PATIENT DISPOSITION:  PACU - hemodynamically stable.   Delay start of Pharmacological VTE agent (>24hrs) due to surgical blood loss or risk of bleeding: not applicable

## 2014-12-13 NOTE — Care Management Note (Signed)
Case Management Note  Patient Details  Name: Frank Davis MRN: [id number] Date of Birth: Nov 12, 1980  Subjective/Objective:                 Pt  from home admitted with ruptured appendix.   Action/Plan: Return to home when medically stable. CM to f/u with d/ disposition.  Expected Discharge Date:                  Expected Discharge Plan:  Home/Self Care  In-House Referral:     Discharge planning Services  CM Consult  Post Acute Care Choice:    Choice offered to:     DME Arranged:    DME Agency:     HH Arranged:    HH Agency:     Status of Service:  In process, will continue to follow  Medicare Important Message Given:    Date Medicare IM Given:    Medicare IM give by:    Date Additional Medicare IM Given:    Additional Medicare Important Message give by:     If discussed at Long Length of Stay Meetings, dates discussed:    Additional Comments: Pt with no insurance and no PCP. CM provided pt with information on [facility]. Appointment scheduled for 12/19/14 @  3:00pm   to establish PCP and medication needs @ d/c. Pt made aware by CM. Cm to f/u with d/c needs. [name], [name], [place]  [phone] 12/13/2014, 10:05 AM

## 2014-12-13 NOTE — Progress Notes (Deleted)
Discussed in LOS meeting. Tahara Ruffini ColeRN,BSN,CM [phone] 

## 2014-12-14 LAB — BASIC METABOLIC PANEL
Anion gap: 9 (ref 5–15)
BUN: 19 mg/dL (ref 6–20)
CO2: 22 mmol/L (ref 22–32)
CREATININE: 1.33 mg/dL — AB (ref 0.61–1.24)
Calcium: 7.6 mg/dL — ABNORMAL LOW (ref 8.9–10.3)
Chloride: 97 mmol/L — ABNORMAL LOW (ref 101–111)
GFR calc Af Amer: >60 mL/min (ref >60)
GLUCOSE: 112 mg/dL — AB (ref 65–99)
Potassium: 4 mmol/L (ref 3.5–5.1)
Sodium: 128 mmol/L — ABNORMAL LOW (ref 135–145)

## 2014-12-14 LAB — CBC
HCT: 31.9 % — ABNORMAL LOW (ref 39.0–52.0)
Hemoglobin: 10.6 g/dL — ABNORMAL LOW (ref 13.0–17.0)
MCH: 31.3 pg (ref 26.0–34.0)
MCHC: 33.2 g/dL (ref 30.0–36.0)
MCV: 94.1 fL (ref 78.0–100.0)
PLATELETS: 224 10*3/uL (ref 150–400)
RBC: 3.39 MIL/uL — AB (ref 4.22–5.81)
RDW: 12.8 % (ref 11.5–15.5)
WBC: 10.8 10*3/uL — AB (ref 4.0–10.5)

## 2014-12-14 MED ORDER — SODIUM CHLORIDE 0.9 % IV BOLUS (SEPSIS)
1000.0000 mL | Freq: Once | INTRAVENOUS | Status: AC
  Administered 2014-12-14: 1000 mL via INTRAVENOUS

## 2014-12-14 NOTE — Clinical Documentation Improvement (Signed)
Please specify diagnosis and time used for release of adhesions for below supporting information, if appropriate?  Possible Clinical Conditions? _______Small bowel lysis of adhesions performed for   ____ minutes. _______Retroperitoneum lysis of adhesions performed for  ____ minutes. _______Release of small bowel adhesions from the anterior abdominal wall performed for ___ minutes.  _______Other Condition__________________ _______Cannot Clinically Determine   Supporting Information: Per 12/13/14 OP note =  There appeared to be a large amount of small bowel adhesions to the anterior abdominal wall. Subsequent to this a 5 mm trocar was then placed in the right subcostal margin. Bilateral camera in this trocar there is apparently a large amount of small bowel distention and turbid fluid collection that was causing small bowel U Spencer abdominal wall. At this time a 10 mm trochars placed in the midline just superior to the umbilicus. A Lowsley retractor was advanced in this area and direct visualization. At this time I proceeded to gently take down the small bowel adhesions from the anterior abdominal wall.   Thank You, Shelda Palarlene H Niveah Boerner ,RN Clinical Documentation Specialist:  [phone]  [facility]- Health Information Management

## 2014-12-14 NOTE — Progress Notes (Signed)
Pt transferred to 6N02 per MD order. Report called to receiving nurse and all questions answered.

## 2014-12-14 NOTE — Progress Notes (Signed)
Patient ID: Frank Davis, male   DOB: 1981-05-04, 34 y.o.   MRN: [id number] 2 Days Post-Op  Subjective: Pt has pain, but controlled with PCA.  No flatus yet.  Foley clogged this AM and Dc.  Patient voiding well  Objective: Vital signs in last 24 hours: Temp:  [97.9 F (36.6 C)-99.6 F (37.6 C)] 97.9 F (36.6 C) (07/08 0410) Pulse Rate:  [100-126] 100 (07/08 0410) Resp:  [10-24] 14 (07/08 0422) BP: (120-138)/(69-83) 123/69 mmHg (07/08 0410) SpO2:  [92 %-97 %] 96 % (07/08 0422) Last BM Date: 12/11/14  Intake/Output from previous day: 07/07 0701 - 07/08 0700 In: 4069.2 [I.V.:2879.2; NG/GT:90; IV Piggyback:1100] Out: 2555 [Urine:1950; Emesis/NG output:550; Drains:55] Intake/Output this shift:    PE: Abd: soft, but distended, NGT with bilious output, hypoactive BS, midline wound is clean and packed.  JP with yellow serous drainage. Heart: tachy, but regular Lungs: CTAB  Lab Results:   Recent Labs  12/13/14 0210 12/14/14 0232  WBC 12.6* 10.8*  HGB 13.0 10.6*  HCT 37.8* 31.9*  PLT 245 224   BMET  Recent Labs  12/13/14 0210 12/14/14 0232  NA 129* 128*  K 4.2 4.0  CL 98* 97*  CO2 20* 22  GLUCOSE 135* 112*  BUN 30* 19  CREATININE 1.95* 1.33*  CALCIUM 8.0* 7.6*   PT/INR No results for input(s): LABPROT, INR in the last 72 hours. CMP     Component Value Date/Time   NA 128* 12/14/2014 0232   K 4.0 12/14/2014 0232   CL 97* 12/14/2014 0232   CO2 22 12/14/2014 0232   GLUCOSE 112* 12/14/2014 0232   BUN 19 12/14/2014 0232   CREATININE 1.33* 12/14/2014 0232   CALCIUM 7.6* 12/14/2014 0232   PROT 8.6* 12/12/2014 1250   ALBUMIN 3.5 12/12/2014 1250   AST 16 12/12/2014 1250   ALT 12* 12/12/2014 1250   ALKPHOS 51 12/12/2014 1250   BILITOT 2.2* 12/12/2014 1250   GFRNONAA >60 12/14/2014 0232   GFRAA >60 12/14/2014 0232   Lipase     Component Value Date/Time   LIPASE 11* 12/12/2014 1250       Studies/Results: Ct Abdomen Pelvis W Contrast  12/12/2014    CLINICAL DATA:  Generalized abdominal pain for 4 days with nausea, vomiting, diarrhea, and fever.  EXAM: CT ABDOMEN AND PELVIS WITH CONTRAST  TECHNIQUE: Multidetector CT imaging of the abdomen and pelvis was performed using the standard protocol following bolus administration of intravenous contrast.  CONTRAST:  100mL OMNIPAQUE IOHEXOL 300 MG/ML  SOLN  COMPARISON:  None.  FINDINGS: Motion artifact and subsegmental atelectasis are noted in the lung bases. Oral contrast is present in the visualized distal thoracic esophagus and may represent esophageal dysmotility or gastroesophageal reflux.  No focal liver lesion is identified. The gallbladder, spleen, adrenal glands, right kidney, and pancreas are unremarkable. There is a 10 mm soft tissue density lesion projecting posteriorly from the lower pole of the left kidney, too small to fully characterize.  There is prominent inflammatory stranding and a small amount of free fluid in the right lower quadrant and pelvis. The appendix is located centrally within this region of inflammation and is dilated, measuring 12 mm in diameter, and demonstrates a hyperenhancing wall. There is a small amount of intraperitoneal free air in the lower abdomen and pelvis, the vast majority of which is along the superior aspect of the appendix.  There is moderate wall thickening and hyper enhancement involving the distal ileum with a 3.2 x 2.3 cm gas  and fluid collection in the pelvis abutting the inferior aspect of the distal ileum (series 2, image 90 and series 6, image 85). A smaller loculated gas and fluid collection in the anterior left pelvis measures up to 9 mm in thickness and extends over a length of approximately 5 cm (series 2, image 90). A loculated rim enhancing fluid collection more posteriorly in the pelvis located between the bladder and rectum measures 3.2 x 2.1 cm (series 2, image 98). There is mild dilatation of multiple small bowel loops up to 3.6 cm in diameter. The colon  is decompressed. There is mild wall thickening of the sigmoid colon.  Small right mid and lower abdominal mesenteric lymph nodes measure up to 6 mm in short axis and are likely reactive. Bladder is nondistended and appears mildly thick-walled, likely related to adjacent inflammation. No acute osseous abnormality is identified.  IMPRESSION: 1. Extensive inflammation in the right lower quadrant and pelvis with small volume pneumoperitoneum. This is favored to represent perforated appendicitis with secondary inflammatory changes involving the distal ileum and sigmoid colon rather than a primary small or large bowel process. 2. 3 small loculated fluid collections in the pelvis, concerning for developing abscesses. 3. Mild small bowel dilatation likely reflecting ileus. 4. 10 mm indeterminate left renal lesion. Further evaluation with abdominal MRI (without and with IV contrast) is recommended on an outpatient basis after the patient's acute illness has resolved. Critical Value/emergent results were called by telephone at the time of interpretation on 12/12/2014 at 1:55 pm to Dr. Geoffery Lyons , who verbally acknowledged these results.   Electronically Signed   By: Sebastian Ache   On: 12/12/2014 13:59    Anti-infectives: Anti-infectives    Start     Dose/Rate Route Frequency Ordered Stop   12/12/14 2200  piperacillin-tazobactam (ZOSYN) IVPB 3.375 g     3.375 g 12.5 mL/hr over 240 Minutes Intravenous Every 8 hours 12/12/14 1937     12/12/14 1415  piperacillin-tazobactam (ZOSYN) IVPB 3.375 g     3.375 g 12.5 mL/hr over 240 Minutes Intravenous  Once 12/12/14 1410 12/12/14 1819       Assessment/Plan  1. POD 2, s/p lap converted to open appy for perforated appendicitis, Dr. Derrell Lolling -post op ileus, await bowel function, cont NGT -cont abx therapy -Zosyn D3/?7 -cont BID dressing changes -cont JP drain -mobilize TID -cont PCA -DC cardiac monitoring -transfer to the floor 2. ARI -Cr 1.33 today. -cont  fluid hydration as this continues to improve -recheck labs in am   LOS: 2 days    Eboney Claybrook E 12/14/2014, 8:08 AM Pager: [phone]

## 2014-12-14 NOTE — Progress Notes (Signed)
Patient transferred to room 6N02 at this time. Alert and in stable condition.

## 2014-12-14 NOTE — Progress Notes (Signed)
Unable to irrigate foley. Pt c/o pressure 9/10. Foley removed. Pt instantly voided 400 mL of orange urine with sediment. Will continue to monitor.

## 2014-12-14 NOTE — Progress Notes (Addendum)
50 mL of orange concentrated urine output with sediment out of foley in last 4 hours and pt c/o of pressure 10/10. Bladder scanner volume is 150 mL. MD notified. New orders received to administer a 1L NS bolus over 2 hours and irrigate foley. Will carry out orders and continue to monitor.

## 2014-12-14 NOTE — Progress Notes (Signed)
Pt c/o of burning pain 10/10 in abdomen. MD notified. No new orders received. Will continue to monitor.

## 2014-12-15 LAB — CBC
HEMATOCRIT: 31.9 % — AB (ref 39.0–52.0)
HEMOGLOBIN: 10.6 g/dL — AB (ref 13.0–17.0)
MCH: 30.7 pg (ref 26.0–34.0)
MCHC: 33.2 g/dL (ref 30.0–36.0)
MCV: 92.5 fL (ref 78.0–100.0)
Platelets: 281 10*3/uL (ref 150–400)
RBC: 3.45 MIL/uL — AB (ref 4.22–5.81)
RDW: 12.7 % (ref 11.5–15.5)
WBC: 6.2 10*3/uL (ref 4.0–10.5)

## 2014-12-15 LAB — BASIC METABOLIC PANEL
Anion gap: 8 (ref 5–15)
BUN: 11 mg/dL (ref 6–20)
CO2: 26 mmol/L (ref 22–32)
Calcium: 8.1 mg/dL — ABNORMAL LOW (ref 8.9–10.3)
Chloride: 98 mmol/L — ABNORMAL LOW (ref 101–111)
Creatinine, Ser: 0.93 mg/dL (ref 0.61–1.24)
GFR calc non Af Amer: >60 mL/min (ref >60)
Glucose, Bld: 125 mg/dL — ABNORMAL HIGH (ref 65–99)
POTASSIUM: 3.6 mmol/L (ref 3.5–5.1)
Sodium: 132 mmol/L — ABNORMAL LOW (ref 135–145)

## 2014-12-15 MED ORDER — PANTOPRAZOLE SODIUM 40 MG IV SOLR
40.0000 mg | Freq: Two times a day (BID) | INTRAVENOUS | Status: DC
  Filled 2014-12-15: qty 40

## 2014-12-15 MED ORDER — PANTOPRAZOLE SODIUM 40 MG IV SOLR
40.0000 mg | Freq: Two times a day (BID) | INTRAVENOUS | Status: DC
  Administered 2014-12-15 – 2014-12-18 (×7): 40 mg via INTRAVENOUS
  Filled 2014-12-15 (×7): qty 40

## 2014-12-15 NOTE — Progress Notes (Signed)
Applied Montgomery straps to wound site and changed dressing.  Wound is clean.

## 2014-12-15 NOTE — Progress Notes (Signed)
General Surgery Note  LOS: 3 days  POD -  3 Days Post-Op  Assessment/Plan: 1.  LAPAROSCOPY DIAGNOSTIC, EXPLORATORY LAPAROTOMY, APPENDECTOMY - 12/12/2014 Frank Davis  On zosyn - 7/6 >>>  WBC - 6,200 - 12/15/2014  Has NGT - still has ileus, will leave NGT  1A.  Open wound - looks good  2.  ARI - creatinine normal  3.  DVT prophylaxis - Lovenox   Principal Problem:   Acute perforated appendicitis Active Problems:   Acute renal insufficiency   Subjective:  Somewhat anxious.  Girlfriend, Frank Davis, in room.  Does not like NGT.  No flatus or BM. Objective:   Filed Vitals:   12/15/14 0800  BP:   Pulse:   Temp:   Resp: 14     Intake/Output from previous day:  07/08 0701 - 07/09 0700 In: 1575 [I.V.:1475; NG/GT:100] Out: 2923 [Urine:1050; Emesis/NG output:1830; Drains:43]  Intake/Output this shift:      Physical Exam:   General: WN WM who is alert and oriented.    HEENT: Normal. Pupils equal. .   Lungs: Clear.   Abdomen: Quiet.  Distended.   Wound: Clean.   Lab Results:    Recent Labs  12/14/14 0232 12/15/14 0326  WBC 10.8* 6.2  HGB 10.6* 10.6*  HCT 31.9* 31.9*  PLT 224 281    BMET   Recent Labs  12/14/14 0232 12/15/14 0326  NA 128* 132*  K 4.0 3.6  CL 97* 98*  CO2 22 26  GLUCOSE 112* 125*  BUN 19 11  CREATININE 1.33* 0.93  CALCIUM 7.6* 8.1*    PT/INR  No results for input(s): LABPROT, INR in the last 72 hours.  ABG  No results for input(s): PHART, HCO3 in the last 72 hours.  Invalid input(s): PCO2, PO2   Studies/Results:  No results found.   Anti-infectives:   Anti-infectives    Start     Dose/Rate Route Frequency Ordered Stop   12/12/14 2200  piperacillin-tazobactam (ZOSYN) IVPB 3.375 g     3.375 g 12.5 mL/hr over 240 Minutes Intravenous Every 8 hours 12/12/14 1937     12/12/14 1415  piperacillin-tazobactam (ZOSYN) IVPB 3.375 g     3.375 g 12.5 mL/hr over 240 Minutes Intravenous  Once 12/12/14 1410 12/12/14 1819      Ovidio Kinavid Ariyanah Aguado, MD,  FACS Pager: [phone] [facility] Office: [phone] 12/15/2014

## 2014-12-16 LAB — BASIC METABOLIC PANEL
Anion gap: 8 (ref 5–15)
BUN: 6 mg/dL (ref 6–20)
CHLORIDE: 98 mmol/L — AB (ref 101–111)
CO2: 27 mmol/L (ref 22–32)
Calcium: 8.1 mg/dL — ABNORMAL LOW (ref 8.9–10.3)
Creatinine, Ser: 0.9 mg/dL (ref 0.61–1.24)
GFR calc Af Amer: >60 mL/min (ref >60)
GFR calc non Af Amer: >60 mL/min (ref >60)
GLUCOSE: 115 mg/dL — AB (ref 65–99)
POTASSIUM: 3.9 mmol/L (ref 3.5–5.1)
Sodium: 133 mmol/L — ABNORMAL LOW (ref 135–145)

## 2014-12-16 MED ORDER — PANTOPRAZOLE SODIUM 40 MG IV SOLR
40.0000 mg | Freq: Once | INTRAVENOUS | Status: AC
  Administered 2014-12-16: 40 mg via INTRAVENOUS

## 2014-12-16 MED ORDER — PANTOPRAZOLE SODIUM 40 MG PO TBEC: 40.0000 mg | DELAYED_RELEASE_TABLET | Freq: Once | ORAL | Status: DC

## 2014-12-16 NOTE — Progress Notes (Signed)
General Surgery Note  LOS: 4 days  POD -  4 Days Post-Op  Assessment/Plan: 1.  LAPAROSCOPY DIAGNOSTIC, EXPLORATORY LAPAROTOMY, APPENDECTOMY - 12/12/2014 Frank Davis  On zosyn - 7/6 >>>  WBC - 6,200 - 12/15/2014  Will try clamping NGT.  If tolerated, may remove NGT later today.  But he looks better.  1A.  Open wound - looks good  2.  ARI - creatinine normal 3.  DVT prophylaxis - Lovenox   Principal Problem:   Acute perforated appendicitis Active Problems:   Acute renal insufficiency   Subjective:  Looks much better today.  Still no flatus or BM.  Discussed about trying to clamp NGT.  Girlfriend, Frank Davis, in room.   Objective:   Filed Vitals:   12/16/14 0741  BP:   Pulse:   Temp:   Resp: 13     Intake/Output from previous day:  07/09 0701 - 07/10 0700 In: 1420 [P.O.:120; I.V.:1150; IV Piggyback:150] Out: 2135 [Urine:1325; Emesis/NG output:800; Drains:10]  Intake/Output this shift:  Total I/O In: -  Out: 600 [Emesis/NG output:600]   Physical Exam:   General: WN WM who is alert and oriented.    HEENT: Normal. Pupils equal. .   Lungs: Clear.   Abdomen: Has BS today.  Still mildly distended.   Wound: Clean.  Has montgomery straps.  JP - 10 cc recorded yesterday   Lab Results:     Recent Labs  12/14/14 0232 12/15/14 0326  WBC 10.8* 6.2  HGB 10.6* 10.6*  HCT 31.9* 31.9*  PLT 224 281    BMET    Recent Labs  12/15/14 0326 12/16/14 0445  NA 132* 133*  K 3.6 3.9  CL 98* 98*  CO2 26 27  GLUCOSE 125* 115*  BUN 11 6  CREATININE 0.93 0.90  CALCIUM 8.1* 8.1*    PT/INR  No results for input(s): LABPROT, INR in the last 72 hours.  ABG  No results for input(s): PHART, HCO3 in the last 72 hours.  Invalid input(s): PCO2, PO2   Studies/Results:  No results found.   Anti-infectives:   Anti-infectives    Start     Dose/Rate Route Frequency Ordered Stop   12/12/14 2200  piperacillin-tazobactam (ZOSYN) IVPB 3.375 g     3.375 g 12.5 mL/hr over 240 Minutes  Intravenous Every 8 hours 12/12/14 1937     12/12/14 1415  piperacillin-tazobactam (ZOSYN) IVPB 3.375 g     3.375 g 12.5 mL/hr over 240 Minutes Intravenous  Once 12/12/14 1410 12/12/14 1819      Ovidio Kinavid Mel Tadros, MD, FACS Pager: [phone] [facility] Office: [phone] 12/16/2014

## 2014-12-16 NOTE — Progress Notes (Signed)
Pt has had NGT clamped since 0830 this am.  Only 25 cc residual once reconnected to suction.  DC'd NGT.

## 2014-12-17 LAB — CBC WITH DIFFERENTIAL/PLATELET
BASOS ABS: 0 10*3/uL (ref 0.0–0.1)
BASOS PCT: 0 % (ref 0–1)
Eosinophils Absolute: 0.2 10*3/uL (ref 0.0–0.7)
Eosinophils Relative: 3 % (ref 0–5)
HCT: 31.7 % — ABNORMAL LOW (ref 39.0–52.0)
HEMOGLOBIN: 10.7 g/dL — AB (ref 13.0–17.0)
LYMPHS ABS: 1.2 10*3/uL (ref 0.7–4.0)
Lymphocytes Relative: 15 % (ref 12–46)
MCH: 31.8 pg (ref 26.0–34.0)
MCHC: 33.8 g/dL (ref 30.0–36.0)
MCV: 94.1 fL (ref 78.0–100.0)
MONO ABS: 1 10*3/uL (ref 0.1–1.0)
Monocytes Relative: 13 % — ABNORMAL HIGH (ref 3–12)
Neutro Abs: 5.3 10*3/uL (ref 1.7–7.7)
Neutrophils Relative %: 69 % (ref 43–77)
PLATELETS: 383 10*3/uL (ref 150–400)
RBC: 3.37 MIL/uL — AB (ref 4.22–5.81)
RDW: 12.9 % (ref 11.5–15.5)
WBC: 7.7 10*3/uL (ref 4.0–10.5)

## 2014-12-17 MED ORDER — METHOCARBAMOL 1000 MG/10ML IJ SOLN
1000.0000 mg | Freq: Three times a day (TID) | INTRAVENOUS | Status: DC
  Administered 2014-12-17 – 2014-12-18 (×3): 1000 mg via INTRAVENOUS
  Filled 2014-12-17 (×6): qty 10

## 2014-12-17 MED ORDER — BISACODYL 10 MG RE SUPP
10.0000 mg | Freq: Every day | RECTAL | Status: DC | PRN
  Filled 2014-12-17: qty 1

## 2014-12-17 NOTE — Progress Notes (Signed)
[facility] Progress Note  5 Days Post-Op  Subjective: Pt still with lots of pain.  Says he he needs PCA to even get up and move around.  He did 8 laps yesterday and sat in the rocking chair for several hours yesterday.  Urinating well.  No flatus or BM yet.  Tolerating sips of clears well.    Objective: Vital signs in last 24 hours: Temp:  [98.2 F (36.8 C)-98.4 F (36.9 C)] 98.4 F (36.9 C) (07/11 0550) Pulse Rate:  [95-109] 99 (07/11 0550) Resp:  [16-24] 18 (07/11 0730) BP: (113-132)/(69-78) 113/76 mmHg (07/11 0550) SpO2:  [92 %-99 %] 92 % (07/11 0730) Last BM Date: 12/11/14  Intake/Output from previous day: 07/10 0701 - 07/11 0700 In: 3104.2 [I.V.:2904.2; IV Piggyback:200] Out: 1185 [Urine:575; Emesis/NG output:600; Drains:10] Intake/Output this shift:    PE: Gen:  Alert, NAD, pleasant Abd: Soft, distended, mildly tender throughout, +BS, no HSM, midline wound just changes and montgomery straps in place, will check wound tomorrow, drain with serosanguinous drainage (3210mL/24hr)   Lab Results:   Recent Labs  12/15/14 0326 12/17/14 0505  WBC 6.2 7.7  HGB 10.6* 10.7*  HCT 31.9* 31.7*  PLT 281 383   BMET  Recent Labs  12/15/14 0326 12/16/14 0445  NA 132* 133*  K 3.6 3.9  CL 98* 98*  CO2 26 27  GLUCOSE 125* 115*  BUN 11 6  CREATININE 0.93 0.90  CALCIUM 8.1* 8.1*   PT/INR No results for input(s): LABPROT, INR in the last 72 hours. CMP     Component Value Date/Time   NA 133* 12/16/2014 0445   K 3.9 12/16/2014 0445   CL 98* 12/16/2014 0445   CO2 27 12/16/2014 0445   GLUCOSE 115* 12/16/2014 0445   BUN 6 12/16/2014 0445   CREATININE 0.90 12/16/2014 0445   CALCIUM 8.1* 12/16/2014 0445   PROT 8.6* 12/12/2014 1250   ALBUMIN 3.5 12/12/2014 1250   AST 16 12/12/2014 1250   ALT 12* 12/12/2014 1250   ALKPHOS 51 12/12/2014 1250   BILITOT 2.2* 12/12/2014 1250   GFRNONAA >60 12/16/2014 0445   GFRAA >60 12/16/2014 0445   Lipase     Component  Value Date/Time   LIPASE 11* 12/12/2014 1250       Studies/Results: No results found.  Anti-infectives: Anti-infectives    Start     Dose/Rate Route Frequency Ordered Stop   12/12/14 2200  piperacillin-tazobactam (ZOSYN) IVPB 3.375 g     3.375 g 12.5 mL/hr over 240 Minutes Intravenous Every 8 hours 12/12/14 1937     12/12/14 1415  piperacillin-tazobactam (ZOSYN) IVPB 3.375 g     3.375 g 12.5 mL/hr over 240 Minutes Intravenous  Once 12/12/14 1410 12/12/14 1819       Assessment/Plan Ruptured appendicitis POD #5 s/p LAPAROSCOPY DIAGNOSTIC, EXPLORATORY LAPAROTOMY, APPENDECTOMY - 12/12/2014 - Derrell Lollingamirez -On zosyn Day #6/14 - 7/6 >>> -WBC - 7.7 -Clamping trials went well, NG out and tolerating that well.  On only sips since no flatus yet.  Advance when passing flatus to clears. -Open wound, dressing just changed -Ambulate and IS -Need to try and limit narcotics, add robaxin and ice, d/c PCA tomorrow if tolerating PO (could add PO's to IV push)  ARI - resolved, creatinine normal DVT prophylaxis - Lovenox and SCD's    LOS: 5 days    Nonie HoyerMegan N Mackena Plummer 12/17/2014, 9:02 AM Pager: [phone]   Addendum 10:45am, called by nurse, he passed gas and had a BM.  Will advance  to clears.

## 2014-12-18 MED ORDER — AMOXICILLIN-POT CLAVULANATE 875-125 MG PO TABS
1.0000 | ORAL_TABLET | Freq: Two times a day (BID) | ORAL | Status: DC
  Administered 2014-12-18 (×2): 1 via ORAL
  Filled 2014-12-18 (×2): qty 1

## 2014-12-18 MED ORDER — METHOCARBAMOL 500 MG PO TABS: 1000.0000 mg | ORAL_TABLET | Freq: Three times a day (TID) | ORAL | Status: DC | PRN

## 2014-12-18 MED ORDER — OXYCODONE-ACETAMINOPHEN 5-325 MG PO TABS
1.0000 | ORAL_TABLET | ORAL | Status: DC | PRN
  Administered 2014-12-18 – 2014-12-19 (×6): 2 via ORAL
  Filled 2014-12-18 (×6): qty 2

## 2014-12-18 NOTE — Progress Notes (Signed)
Patient ID: Frank Davis, male   DOB: 1981/04/05, 34 y.o.   MRN: [id number] 6 Days Post-Op  Subjective: Pt feels better today.  Tolerating clears with less distention.  Having flatus and multiple BMs.  Pain well controlled, but tired of his PCA.  Objective: Vital signs in last 24 hours: Temp:  [98.3 F (36.8 C)-98.5 F (36.9 C)] 98.4 F (36.9 C) (07/12 0547) Pulse Rate:  [82-91] 82 (07/12 0547) Resp:  [11-22] 22 (07/12 0800) BP: (126-133)/(75-84) 128/84 mmHg (07/12 0547) SpO2:  [94 %-100 %] 95 % (07/12 0800) Last BM Date: 12/17/14  Intake/Output from previous day: 07/11 0701 - 07/12 0700 In: 4074.5 [P.O.:960; I.V.:3044.5; IV Piggyback:70] Out: 3470 [Urine:3400; Drains:70] Intake/Output this shift:    PE: Abd: soft, appropriately tender, midline wound is clean, JP drain with serous drainage, +BS Heart: regular LungS: CTAB  Lab Results:   Recent Labs  12/17/14 0505  WBC 7.7  HGB 10.7*  HCT 31.7*  PLT 383   BMET  Recent Labs  12/16/14 0445  NA 133*  K 3.9  CL 98*  CO2 27  GLUCOSE 115*  BUN 6  CREATININE 0.90  CALCIUM 8.1*   PT/INR No results for input(s): LABPROT, INR in the last 72 hours. CMP     Component Value Date/Time   NA 133* 12/16/2014 0445   K 3.9 12/16/2014 0445   CL 98* 12/16/2014 0445   CO2 27 12/16/2014 0445   GLUCOSE 115* 12/16/2014 0445   BUN 6 12/16/2014 0445   CREATININE 0.90 12/16/2014 0445   CALCIUM 8.1* 12/16/2014 0445   PROT 8.6* 12/12/2014 1250   ALBUMIN 3.5 12/12/2014 1250   AST 16 12/12/2014 1250   ALT 12* 12/12/2014 1250   ALKPHOS 51 12/12/2014 1250   BILITOT 2.2* 12/12/2014 1250   GFRNONAA >60 12/16/2014 0445   GFRAA >60 12/16/2014 0445   Lipase     Component Value Date/Time   LIPASE 11* 12/12/2014 1250       Studies/Results: No results found.  Anti-infectives: Anti-infectives    Start     Dose/Rate Route Frequency Ordered Stop   12/18/14 1015  amoxicillin-clavulanate (AUGMENTIN) 875-125 MG per tablet 1  tablet     1 tablet Oral Every 12 hours 12/18/14 1000     12/12/14 2200  piperacillin-tazobactam (ZOSYN) IVPB 3.375 g  Status:  Discontinued     3.375 g 12.5 mL/hr over 240 Minutes Intravenous Every 8 hours 12/12/14 1937 12/18/14 1000   12/12/14 1415  piperacillin-tazobactam (ZOSYN) IVPB 3.375 g     3.375 g 12.5 mL/hr over 240 Minutes Intravenous  Once 12/12/14 1410 12/12/14 1819       Assessment/Plan  Ruptured appendicitis POD #5 s/p LAPAROSCOPY DIAGNOSTIC, EXPLORATORY LAPAROTOMY, APPENDECTOMY - 12/12/2014 - Derrell Lollingamirez -Zosyn DC today, changed to oral augmentin Day #7/14  -WBC - 7.7 -advance to fulls and soft tomorrow am -cont BID dressing changes, HH arranged to assist with this at home -Ambulate and IS -DC PCA, change to oral narcotics -SLIV Change to po robaxin -? DC JP drain today  ARI - resolved, creatinine normal DVT prophylaxis - Lovenox and SCD's   -maybe home tomorrow pending progression of diet and oral meds  LOS: 6 days    Kimi Bordeau E 12/18/2014, 10:03 AM Pager: [phone]

## 2014-12-18 NOTE — Care Management Note (Signed)
Case Management Note  Patient Details  Name: Frank Davis MRN: [id number] Date of Birth: 1980-10-09  Subjective/Objective:       appendectomy             Action/Plan:  Home Health  Expected Discharge Date:  12/19/2014               Expected Discharge Plan:  Home w Home Health Services  In-House Referral:     Discharge planning Services  CM Consult, Follow-up appt scheduled   [facility]  Status of Service:  Completed, signed off  Medicare Important Message Given:    Date Medicare IM Given:    Medicare IM give by:    Date Additional Medicare IM Given:    Additional Medicare Important Message give by:     If discussed at Long Length of Stay Meetings, dates discussed:    Additional Comments: NCM spoke to pt and HH arranged with [facility]. Pt states he works and will be able to afford his medications. Explained [facility] to [facility] meds at a discounted rate except narcotics. [facility] at 11:15 am. [facility] at [facility] of [facility].  Frank Davis, Frank Humbarger Ellen, RN 12/18/2014, 4:06 PM

## 2014-12-19 ENCOUNTER — Inpatient Hospital Stay: Payer: Self-pay | Admitting: Family Medicine

## 2014-12-19 LAB — CREATININE, SERUM
CREATININE: 0.79 mg/dL (ref 0.61–1.24)
GFR calc Af Amer: >60 mL/min (ref >60)
GFR calc non Af Amer: >60 mL/min (ref >60)

## 2014-12-19 MED ORDER — METHOCARBAMOL 500 MG PO TABS: 1000.0000 mg | ORAL_TABLET | Freq: Three times a day (TID) | ORAL | Status: AC | PRN

## 2014-12-19 MED ORDER — AMOXICILLIN-POT CLAVULANATE 875-125 MG PO TABS: 1.0000 | ORAL_TABLET | Freq: Two times a day (BID) | ORAL | Status: DC

## 2014-12-19 MED ORDER — OXYCODONE-ACETAMINOPHEN 5-325 MG PO TABS: 1.0000 | ORAL_TABLET | ORAL | Status: AC | PRN

## 2014-12-19 MED ORDER — METHOCARBAMOL 500 MG PO TABS: 1000.0000 mg | ORAL_TABLET | Freq: Three times a day (TID) | ORAL | Status: DC | PRN

## 2014-12-19 NOTE — Discharge Summary (Signed)
[facility] Discharge Summary   Patient ID: Frank Davis MRN: [id number] DOB/AGE: Mar 10, 1981 34 y.o.  Admit date: 12/12/2014 Discharge date: 12/19/2014  Admitting Diagnosis: Acute perforated appendicitis Acute renal failure  Discharge Diagnosis Patient Active Problem List   Diagnosis Date Noted  . Acute perforated appendicitis 12/12/2014  . Acute renal insufficiency 12/12/2014    Consultants None  Imaging: No results found.  Procedures Dr. Derrell Lolling (12/13/14) - Laparoscopic converted to open Appendectomy  Hospital Course:  This is a pleasant otherwise healthy 34 yo white male who began having abdominal pain about 4 days ago. He's been trying to tough it out at home, but his pain has persisted and continued to worsen. He has had nausea/vomiting/diarrhea at home. He has had subjective fevers as well. He denies chest pain, but admits to difficulty breathing secondary to abdominal pain. He states that his urine has been darker and he has not been voiding as much as usual. He finally came to the [facility] today where he had a CT scan that revealed acute perforated appendicitis with multiple intra-abdominal fluid collections and a small amount of pneumoperitoneum, focally, in the RLQ. His WBC is 16.8K, Cr 2.30, and pulse 120-130s.   Patient was admitted and underwent procedure listed above.  Tolerated procedure well and was transferred to the SDU for close monitoring.  He experienced mild AKI in the setting of sepsis, but that soon cleared.  He was managed on IV antibiotics.  He had a post-operative ileus for several days which was treated with an NG tube.  Luckily NG was clamping trials went well and NG was removed.  Diet was advanced as tolerated.  He ws transferred to the [facility] medsurg floor. He was transitioned to oral pain medications and antibiotics.  His open wound was packed BID WD dressing with montgomery straps utilized.  His JP drain was discontinued on POD #7 since  it was clear and low output (65mL/24hr).  His staples were removed from the laparoscopic sites and steri-strips applied.  HH nursing has been arranged for him to help manage his post-surgical midline wound.    On POD #7, the patient was voiding well, tolerating diet, ambulating well, pain well controlled, vital signs stable, midline wound clean, tolerating dressing changes, and felt stable for discharge home.  Patient will follow up in our office in 2-3 weeks with Dr. Derrell Lolling and knows to call with questions or concerns.  He will finish a total of 14 days of antibiotics.     Physical Exam: General:  Alert, NAD, pleasant, comfortable Abd:  Soft, ND, mild tenderness, midline wound is clean with beefy red granulation tissue     Medication List    STOP taking these medications        loperamide 2 MG capsule  Commonly known as:  IMODIUM      TAKE these medications        amoxicillin-clavulanate 875-125 MG per tablet  Commonly known as:  AUGMENTIN  Take 1 tablet by mouth every 12 (twelve) hours.     methocarbamol 500 MG tablet  Commonly known as:  ROBAXIN  Take 2 tablets (1,000 mg total) by mouth every 8 (eight) hours as needed for muscle spasms (or pain).     omeprazole 20 MG tablet  Commonly known as:  PRILOSEC OTC  Take 40 mg by mouth daily.     oxyCODONE-acetaminophen 5-325 MG per tablet  Commonly known as:  PERCOCET/ROXICET  Take 1-2 tablets by mouth every 4 (four) hours  as needed for moderate pain.         Follow-up Information    [facility]     On 12/24/2014.   Why:  Cancel this appointment if no longer needed.  Hospital follow-up appointment on 12/24/14 at 11:15 am, please bring photo ID, $20 copay, and discharge instructions   Contact information:   [address] [place] [id number] [phone]      Follow up with [facility].   Why:  Home Health RN   Contact information:    [address] [place] [id number] [phone]       Follow up with Marigene Ehlersamirez Jr., Jed LimerickArmando, MD. Schedule an appointment as soon as possible for a visit in 3 weeks.   Specialty:  General Surgery   Why:  For post-operation check.  Call the office to confirm appointment date/time in 2-3 weeks with Dr. Marnette Burgessamirez   Contact information:   [address] [place] [address] [phone]       Signed: Nonie HoyerMegan N. Chelcie Estorga, [facility] [phone]  12/19/2014, 10:59 AM

## 2014-12-19 NOTE — Progress Notes (Signed)
Contacted [facility] to make aware pt is dc home today. NCM provided pt with goodrx discount coupons for his Rx. States he prefers to use [facility] to pick up meds. Isidoro DonningAlesia Verona Hartshorn RN CCM Case Mgmt phone [phone]

## 2014-12-19 NOTE — Discharge Planning (Signed)
Patient discharged home in stable condition. Verbalizes understanding of all discharge instructions, including home medications and follow up appointments. Pt able to verbalize how to change dressing. Home Health to assist. Staples removed from lap sites.

## 2014-12-24 ENCOUNTER — Encounter: Payer: Self-pay | Admitting: Family Medicine

## 2014-12-24 ENCOUNTER — Ambulatory Visit: Payer: Self-pay | Attending: Family Medicine | Admitting: Family Medicine

## 2014-12-24 VITALS — BP 124/81 | HR 92 | Temp 98.2°F | Resp 16 | Ht 73.0 in | Wt 193.0 lb

## 2014-12-24 DIAGNOSIS — K3532 Acute appendicitis with perforation and localized peritonitis, without abscess: Secondary | ICD-10-CM

## 2014-12-24 DIAGNOSIS — K352 Acute appendicitis with generalized peritonitis: Secondary | ICD-10-CM | POA: Insufficient documentation

## 2014-12-24 LAB — COMPREHENSIVE METABOLIC PANEL
ALK PHOS: 58 U/L (ref 39–117)
ALT: 40 U/L (ref 0–53)
AST: 21 U/L (ref 0–37)
Albumin: 3.9 g/dL (ref 3.5–5.2)
BILIRUBIN TOTAL: 0.4 mg/dL (ref 0.2–1.2)
BUN: 12 mg/dL (ref 6–23)
CALCIUM: 9.6 mg/dL (ref 8.4–10.5)
CHLORIDE: 101 meq/L (ref 96–112)
CO2: 26 mEq/L (ref 19–32)
CREATININE: 0.81 mg/dL (ref 0.50–1.35)
Glucose, Bld: 95 mg/dL (ref 70–99)
Potassium: 5 mEq/L (ref 3.5–5.3)
SODIUM: 140 meq/L (ref 135–145)
Total Protein: 7.5 g/dL (ref 6.0–8.3)

## 2014-12-24 LAB — CBC WITH DIFFERENTIAL/PLATELET
Basophils Absolute: 0 10*3/uL (ref 0.0–0.1)
Basophils Relative: 0 % (ref 0–1)
EOS PCT: 2 % (ref 0–5)
Eosinophils Absolute: 0.2 10*3/uL (ref 0.0–0.7)
HEMATOCRIT: 39.9 % (ref 39.0–52.0)
HEMOGLOBIN: 13.7 g/dL (ref 13.0–17.0)
Lymphocytes Relative: 24 % (ref 12–46)
Lymphs Abs: 2.3 10*3/uL (ref 0.7–4.0)
MCH: 31.8 pg (ref 26.0–34.0)
MCHC: 34.3 g/dL (ref 30.0–36.0)
MCV: 92.6 fL (ref 78.0–100.0)
MONO ABS: 0.7 10*3/uL (ref 0.1–1.0)
MONOS PCT: 7 % (ref 3–12)
MPV: 9.5 fL (ref 8.6–12.4)
NEUTROS ABS: 6.4 10*3/uL (ref 1.7–7.7)
Neutrophils Relative %: 67 % (ref 43–77)
Platelets: 612 10*3/uL — ABNORMAL HIGH (ref 150–400)
RBC: 4.31 MIL/uL (ref 4.22–5.81)
RDW: 13.5 % (ref 11.5–15.5)
WBC: 9.5 10*3/uL (ref 4.0–10.5)

## 2014-12-24 MED ORDER — TRAMADOL HCL 50 MG PO TABS: 50.0000 mg | ORAL_TABLET | Freq: Three times a day (TID) | ORAL | Status: DC | PRN

## 2014-12-24 NOTE — Progress Notes (Signed)
Patient here to follow up after ruptured appendix s/p appendectomy He changes his bandages twice a day and states his pain is 8/10 burning in nature His pain medication is making him nauseous He reports no other health problems except he thinks he has gout but has never been formally diagnosed

## 2014-12-24 NOTE — Progress Notes (Signed)
Patient ID: Frank Davis, male   DOB: May 23, 1981, 34 y.o.   MRN: [id number]    Frank Davis, is a 34 y.o. male  WUJ:[id number]  NFA:[id number]  DOB - 13-Apr-1981   Admit date: 12/12/2014 Discharge date: 12/19/2014  CC:  Chief Complaint  Patient presents with  . appendicitis       HPI:  Frank Davis is a 34 y.o. male here today to establish medical care after hospitalization at [facility] ED for acute perforated appendicitis status post laparoscopic converted to open appendectomy. He had presented to the ED with abdominal pain, nausea, vomiting, diarrhea and subjective fevers. On admission was found to be tachycardic with a heart rate in the 120 to 130s, he had leukocytosis of 16.8 thousand and a creatinine of 2.3. CT abdomen and pelvis revealed perforated appendicitis with multiple intra-abdominal fluid collections and a small amount of pneumoperitoneum focally in the right lower quadrant. He was taken to the OR where he had a laparoscopic converted to open appendectomy on 12/13/14. He was thought to have acute kidney injury and descending of sepsis was placed on IV fluids and IV antibiotics; he did have a postoperative ileus for which an NG tube was placed. Ileus resolved and his diet was advanced as tolerated. Once his condition stabilized, his creatinine improved to 0.79 and WBC to 7.7 he was discharged on Augmentin and advised to follow-up in 2-3 weeks with Gen. surgery.   Interval history: His sister has been performing his dressing changes at home and has been compliant with his Augmentin capsules. He does have Percocet for pain but, complains that he makes him nauseated and so he doesn't take it as needed. He is tolerating orally and moving his bowels without any difficulty. He does not have an upcoming appointment with Gen. surgery for a follow-up. Patient has No headache, No chest pain,  No new weakness tingling or numbness, No Cough - SOB.  No Known Allergies Past Medical History    Diagnosis Date  . H/O pneumothorax    Current Outpatient Prescriptions on File Prior to Visit  Medication Sig Dispense Refill  . amoxicillin-clavulanate (AUGMENTIN) 875-125 MG per tablet Take 1 tablet by mouth every 12 (twelve) hours. 14 tablet 0  . methocarbamol (ROBAXIN) 500 MG tablet Take 2 tablets (1,000 mg total) by mouth every 8 (eight) hours as needed for muscle spasms (or pain). 30 tablet 0  . omeprazole (PRILOSEC OTC) 20 MG tablet Take 40 mg by mouth daily.    Marland Kitchen oxyCODONE-acetaminophen (PERCOCET/ROXICET) 5-325 MG per tablet Take 1-2 tablets by mouth every 4 (four) hours as needed for moderate pain. 50 tablet 0   No current facility-administered medications on file prior to visit.   History reviewed. No pertinent family history. History   Social History  . Marital Status: Single    Spouse Name: N/A  . Number of Children: N/A  . Years of Education: N/A   Occupational History  . Not on file.   Social History Main Topics  . Smoking status: Never Smoker   . Smokeless tobacco: Not on file  . Alcohol Use: Yes     Comment: occasionally  . Drug Use: No  . Sexual Activity: Not on file   Other Topics Concern  . Not on file   Social History Narrative    Review of Systems: Constitutional: Negative for fever, chills, diaphoresis, activity change, appetite change and fatigue. HENT: Negative for ear pain, nosebleeds, congestion, facial swelling, rhinorrhea, neck pain, neck stiffness and ear  discharge.  Eyes: Negative for pain, discharge, redness, itching and visual disturbance. Respiratory: Negative for cough, choking, chest tightness, shortness of breath, wheezing and stridor.  Cardiovascular: Negative for chest pain, palpitations and leg swelling. Gastrointestinal: Negative for abdominal distention. Positive for abdominal pain. Genitourinary: Negative for dysuria, urgency, frequency, hematuria, flank pain, decreased urine volume, difficulty urinating and dyspareunia.   Musculoskeletal: Negative for back pain, joint swelling, arthralgia and gait problem. Neurological: Negative for dizziness, tremors, seizures, syncope, facial asymmetry, speech difficulty, weakness, light-headedness, numbness and headaches.  Hematological: Negative for adenopathy. Does not bruise/bleed easily. Psychiatric/Behavioral: Negative for hallucinations, behavioral problems, confusion, dysphoric mood, decreased concentration and agitation.    Objective:   Filed Vitals:   12/24/14 1110  BP: 124/81  Pulse: 92  Temp: 98.2 F (36.8 C)  Resp: 16    Physical Exam: Constitutional: Patient appears well-developed and well-nourished. No distress. HENT: Normocephalic, atraumatic, External right and left ear normal. Oropharynx is clear and moist.  Eyes: Conjunctivae and EOM are normal. PERRLA, no scleral icterus. Neck: Normal ROM. Neck supple. No JVD. No tracheal deviation. No thyromegaly. CVS: RRR, S1/S2 +, no murmurs, no gallops, no carotid bruit.  Pulmonary: Effort and breath sounds normal, no stridor, rhonchi, wheezes, rales.  Abdominal: Vertical open abdominal wound with beefy red granulation tissue, no evidence of infection.  Musculoskeletal: Normal range of motion. No edema and no tenderness.  Lymphadenopathy: No lymphadenopathy noted, cervical, inguinal or axillary Neuro: Alert. Normal reflexes, muscle tone coordination. No cranial nerve deficit. Skin: Skin is warm and dry. No rash noted. Not diaphoretic. No erythema. No pallor. Psychiatric: Normal mood and affect. Behavior, judgment, thought content normal.  Lab Results  Component Value Date   WBC 7.7 12/17/2014   HGB 10.7* 12/17/2014   HCT 31.7* 12/17/2014   MCV 94.1 12/17/2014   PLT 383 12/17/2014   Lab Results  Component Value Date   CREATININE 0.79 12/19/2014   BUN 6 12/16/2014   NA 133* 12/16/2014   K 3.9 12/16/2014   CL 98* 12/16/2014   CO2 27 12/16/2014       Assessment and plan:  34 year old male  patient status post open appendectomy for perforated appendicitis who is in here today for follow-up visit.  Perforated appendicitis status post open appendectomy: Dressing change performed in the clinic. [facility]. CBC and CMET sent off to evaluate for anemia and also to assess creatinine which was previously elevated during hospitalization. He does have nausea with his Percocet and so I will give him tramadol and have explained to him that he might also experienced some degree of nausea but given the recent acute renal failure I will hold off on using NSAIDs.  The patient was given clear instructions to go to ER or return to medical center if symptoms don't improve, worsen or new problems develop. The patient verbalized understanding. The patient was told to call to get lab results if they haven't heard anything in the next week.     This note has been created with Education officer, environmentalDragon speech recognition software and smart phrase technology. Any transcriptional errors are unintentional.     Jaclyn ShaggyEnobong, Amao, MD. [facility] [phone] 12/24/2014, 11:24 AM

## 2014-12-25 ENCOUNTER — Telehealth: Payer: Self-pay | Admitting: *Deleted

## 2014-12-25 NOTE — Telephone Encounter (Signed)
Verified name and date of birth and gave results.  Patient verbalized understanding. 

## 2014-12-25 NOTE — Telephone Encounter (Signed)
-----   Message from Jaclyn ShaggyEnobong Amao, MD sent at 12/25/2014  8:31 AM EDT ----- Anemia has resolved, platelets are mildly elevated (reactive from previous anemia), other labs are normal.

## 2015-01-08 ENCOUNTER — Ambulatory Visit: Payer: MEDICAID | Attending: Family Medicine | Admitting: Family Medicine

## 2015-01-08 ENCOUNTER — Encounter: Payer: Self-pay | Admitting: Family Medicine

## 2015-01-08 VITALS — BP 136/88 | HR 110 | Temp 98.1°F | Ht 72.0 in | Wt 193.6 lb

## 2015-01-08 DIAGNOSIS — M25572 Pain in left ankle and joints of left foot: Secondary | ICD-10-CM

## 2015-01-08 LAB — URIC ACID: URIC ACID, SERUM: 8 mg/dL — AB (ref 4.0–7.8)

## 2015-01-08 MED ORDER — PREDNISONE 20 MG PO TABS: 20.0000 mg | ORAL_TABLET | Freq: Two times a day (BID) | ORAL | Status: DC

## 2015-01-08 NOTE — Progress Notes (Signed)
Follow up after appendectomy Has a follow up with his surgeon this afternoon States pain is an ache and constant in nature 6/10  Patient changing dressing bid Patient also complains of gout.  He has not been formally diagnosed but says that is what he thinks he has Pain in left great toe but also in his elbow and knee. He is wondering if he can be formally treated and diagnosed

## 2015-01-08 NOTE — Progress Notes (Signed)
Subjective:    Patient ID: Frank Davis, male    DOB: Jul 23, 1980, 34 y.o.   MRN: [id number]  HPI  Frank Davis is a 34 year old male who was seen 2 weeks ago for follow-up visit status post open appendectomy for perforated appendicitis. He completed a course of Augmentin and his sister has assisted him with his wound dressing changes and he is scheduled to see his general surgeon today.  He however complains of intermittent pain in the medial aspect of his left foot which alternates with the right foot and sometimes moves to his ankles and knees. Pain improves when he uses ibuprofen and he does not have a diagnosis of gout. Pain is 6/10 in intensity.  Past Medical History  Diagnosis Date  . H/O pneumothorax     Past Surgical History  Procedure Laterality Date  . Laparoscopy N/A 12/12/2014    Procedure: LAPAROSCOPY DIAGNOSTIC;  Surgeon: Axel Filler, MD;  Location: [facility] OR;  Service: General;  Laterality: N/A;  . Laparotomy N/A 12/12/2014    Procedure: EXPLORATORY LAPAROTOMY;  Surgeon: Axel Filler, MD;  Location[facility];  Service: General;  Laterality: N/A;  . Appendectomy N/A 12/12/2014    Procedure: APPENDECTOMY;  Surgeon: Axel Filler, MD;  Location[facility];  Service: General;  Laterality: N/A;    History   Social History  . Marital Status: Single    Spouse Name: N/A  . Number of Children: N/A  . Years of Education: N/A   Occupational History  . Not on file.   Social History Main Topics  . Smoking status: Never Smoker   . Smokeless tobacco: Not on file  . Alcohol Use: Yes     Comment: occasionally  . Drug Use: No  . Sexual Activity: Not on file   Other Topics Concern  . Not on file   Social History Narrative    No Known Allergies Current Outpatient Prescriptions on File Prior to Visit  Medication Sig Dispense Refill  . omeprazole (PRILOSEC OTC) 20 MG tablet Take 40 mg by mouth daily.    Marland Kitchen oxyCODONE-acetaminophen (PERCOCET/ROXICET) 5-325 MG per tablet  Take 1-2 tablets by mouth every 4 (four) hours as needed for moderate pain. 50 tablet 0  . amoxicillin-clavulanate (AUGMENTIN) 875-125 MG per tablet Take 1 tablet by mouth every 12 (twelve) hours. (Patient not taking: Reported on 01/08/2015) 14 tablet 0  . methocarbamol (ROBAXIN) 500 MG tablet Take 2 tablets (1,000 mg total) by mouth every 8 (eight) hours as needed for muscle spasms (or pain). (Patient not taking: Reported on 01/08/2015) 30 tablet 0  . traMADol (ULTRAM) 50 MG tablet Take 1 tablet (50 mg total) by mouth every 8 (eight) hours as needed. (Patient not taking: Reported on 01/08/2015) 30 tablet 0   No current facility-administered medications on file prior to visit.     Review of Systems  Constitutional: Negative for activity change and appetite change.  HENT: Negative for sinus pressure and sore throat.   Respiratory: Negative for chest tightness, shortness of breath and wheezing.   Cardiovascular: Negative for chest pain and palpitations.  Gastrointestinal: Positive for abdominal pain. Negative for constipation and abdominal distention.  Genitourinary: Negative.   Musculoskeletal:       See hpi  Psychiatric/Behavioral: Negative for behavioral problems and dysphoric mood.         Objective: Filed Vitals:   01/08/15 1407  BP: 136/88  Pulse: 110  Temp: 98.1 F (36.7 C)  Height: 6' (1.829 m)  Weight: 193  lb 9.6 oz (87.816 kg)  SpO2: 97%      Physical Exam  Constitutional: He is oriented to person, place, and time. He appears well-developed and well-nourished.  Cardiovascular: Normal rate, normal heart sounds and intact distal pulses.   No murmur heard. Pulmonary/Chest: Effort normal and breath sounds normal. He has no wheezes. He has no rales. He exhibits no tenderness.  Abdominal: Soft. Bowel sounds are normal. He exhibits no distension and no mass. There is tenderness (Mild tenderness around vertical surgical abdominal wound.).  Musculoskeletal: Normal range of motion.  He exhibits tenderness (Mild erythema and tenderness in the bunion of both feet, left greater than right).  Neurological: He is alert and oriented to person, place, and time.          Assessment & Plan:  34 year old male status post open appendectomy for perforated appendicitis here for follow-up visit and complains of left foot pain.  Perforated appendicitis status post open appendectomy: Doing well with dressing changes. Advised to keep his appointment with his surgeon this afternoon.  Left foot pain: Uric acid sent off to include a refuted diagnosis of gout. Placed on prednisone.  This note has been created with Education officer, environmental. Any transcriptional errors are unintentional.

## 2015-01-10 ENCOUNTER — Other Ambulatory Visit: Payer: Self-pay | Admitting: Family Medicine

## 2015-01-10 DIAGNOSIS — M109 Gout, unspecified: Secondary | ICD-10-CM | POA: Insufficient documentation

## 2015-01-10 DIAGNOSIS — M1009 Idiopathic gout, multiple sites: Secondary | ICD-10-CM

## 2015-01-10 MED ORDER — COLCHICINE 0.6 MG PO TABS: ORAL_TABLET | ORAL | Status: DC

## 2015-01-10 MED ORDER — ALLOPURINOL 300 MG PO TABS: 300.0000 mg | ORAL_TABLET | Freq: Every day | ORAL | Status: DC

## 2015-01-11 ENCOUNTER — Telehealth: Payer: Self-pay | Admitting: *Deleted

## 2015-01-11 NOTE — Telephone Encounter (Signed)
-----   Message from Jaclyn Shaggy, MD sent at 01/10/2015  3:19 PM EDT ----- Please inform him that his labs reveal a diagnosis of Gout and so I have sent a script to the Pharmacy for Allopurinol for maintenance therapy and Colchicine for acute attacks.

## 2015-01-11 NOTE — Telephone Encounter (Signed)
Spoke with patient and verified name and date of birth gave results and instructions for medications.  Patient stated he would pick up medications Monday.

## 2015-01-22 ENCOUNTER — Telehealth: Payer: Self-pay | Admitting: Family Medicine

## 2015-01-22 NOTE — Telephone Encounter (Signed)
Patient stated that they have been experiencing shortness of breathe, as well as a possible reaction to medicine. Please follow up.

## 2015-01-24 ENCOUNTER — Telehealth: Payer: Self-pay | Admitting: *Deleted

## 2015-01-24 NOTE — Telephone Encounter (Signed)
Patient called in because he was concerned about a rash he has developed on his chest and arms.  He states it may be related to his new Rx for gout medication that he was prescribed at his last MD visit with Dr. Venetia Night.  Patient states he has quit taking the medication but the rash is still present.  He does not complain of shortness of breath or trouble swallowing.  I advised him to remain off the medication and transferred him to the scheduler to make a follow up appointment with Dr. Venetia Night.  Patient verbalized understanding and agreement.

## 2015-02-01 ENCOUNTER — Encounter: Payer: Self-pay | Admitting: Family Medicine

## 2015-02-01 ENCOUNTER — Ambulatory Visit: Payer: Self-pay | Attending: Family Medicine | Admitting: Family Medicine

## 2015-02-01 VITALS — BP 131/78 | HR 96 | Temp 98.1°F | Resp 18 | Ht 73.0 in | Wt 198.0 lb

## 2015-02-01 DIAGNOSIS — M1009 Idiopathic gout, multiple sites: Secondary | ICD-10-CM | POA: Insufficient documentation

## 2015-02-01 DIAGNOSIS — L270 Generalized skin eruption due to drugs and medicaments taken internally: Secondary | ICD-10-CM | POA: Insufficient documentation

## 2015-02-01 DIAGNOSIS — T50995A Adverse effect of other drugs, medicaments and biological substances, initial encounter: Secondary | ICD-10-CM | POA: Insufficient documentation

## 2015-02-01 MED ORDER — INDOMETHACIN 50 MG PO CAPS: 50.0000 mg | ORAL_CAPSULE | Freq: Two times a day (BID) | ORAL | Status: DC

## 2015-02-01 MED ORDER — PREDNISONE 10 MG PO TABS: 10.0000 mg | ORAL_TABLET | Freq: Every day | ORAL | Status: DC

## 2015-02-01 NOTE — Patient Instructions (Signed)

## 2015-02-01 NOTE — Progress Notes (Signed)
Subjective:    Patient ID: Frank Davis, male    DOB: Sep 23, 1980, 34 y.o.   MRN: [id number]  HPI Frank Davis is a 34 year old male status post open appendectomy for perforated appendicitis who was recently diagnosed with gout 3 weeks ago and commenced on allopurinol. He obtain the medications 2 weeks ago and shortly after developed a diffuse, erythematous, pruritic rash along with some hives but denied shortness of breath, edema of the lip or face. He used a cream at home the name of which is unknown with mild resolution and pruritus but he still has the rash; allopurinol was stopped as soon as he developed the rash and never got to take the colchicine.  He denies fever, shortness of breath.  Past Medical History  Diagnosis Date  . H/O pneumothorax     Past Surgical History  Procedure Laterality Date  . Laparoscopy N/A 12/12/2014    Procedure: LAPAROSCOPY DIAGNOSTIC;  Surgeon: Axel Filler, MD;  Location: [facility] OR;  Service: General;  Laterality: N/A;  . Laparotomy N/A 12/12/2014    Procedure: EXPLORATORY LAPAROTOMY;  Surgeon: Axel Filler, MD;  Location: [facility] OR;  Service: General;  Laterality: N/A;  . Appendectomy N/A 12/12/2014    Procedure: APPENDECTOMY;  Surgeon: Axel Filler, MD;  Location: [facility] OR;  Service: General;  Laterality: N/A;    Social History   Social History  . Marital Status: Single    Spouse Name: N/A  . Number of Children: N/A  . Years of Education: N/A   Occupational History  . Not on file.   Social History Main Topics  . Smoking status: Never Smoker   . Smokeless tobacco: Not on file  . Alcohol Use: Yes     Comment: occasionally  . Drug Use: No  . Sexual Activity: Not on file   Other Topics Concern  . Not on file   Social History Narrative    Allergies  Allergen Reactions  . Allopurinol Rash    Current Outpatient Prescriptions on File Prior to Visit  Medication Sig Dispense Refill  . methocarbamol (ROBAXIN) 500 MG tablet Take 2  tablets (1,000 mg total) by mouth every 8 (eight) hours as needed for muscle spasms (or pain). 30 tablet 0  . omeprazole (PRILOSEC OTC) 20 MG tablet Take 40 mg by mouth daily.    Marland Kitchen oxyCODONE-acetaminophen (PERCOCET/ROXICET) 5-325 MG per tablet Take 1-2 tablets by mouth every 4 (four) hours as needed for moderate pain. 50 tablet 0  . traMADol (ULTRAM) 50 MG tablet Take 1 tablet (50 mg total) by mouth every 8 (eight) hours as needed. 30 tablet 0  . amoxicillin-clavulanate (AUGMENTIN) 875-125 MG per tablet Take 1 tablet by mouth every 12 (twelve) hours. (Patient not taking: Reported on 01/08/2015) 14 tablet 0  . colchicine 0.6 MG tablet 2 tabs (0.6mg ) orally at the onset of a gout attack; may repeat one tab (0.6mg ) if symptoms persist. (Patient not taking: Reported on 02/01/2015) 30 tablet 1   No current facility-administered medications on file prior to visit.      Review of Systems  Constitutional: Negative for activity change and appetite change.  HENT: Negative for sinus pressure and sore throat.   Respiratory: Negative for chest tightness, shortness of breath and wheezing.   Gastrointestinal: Negative for abdominal pain, constipation and abdominal distention.  Genitourinary: Negative.   Musculoskeletal: Negative.   Skin:       See history of present illness  Psychiatric/Behavioral: Negative for behavioral problems and dysphoric mood.  Objective: Filed Vitals:   02/01/15 1124  BP: 131/78  Pulse: 96  Temp: 98.1 F (36.7 C)  TempSrc: Oral  Resp: 18  Height:  (1.854 m)  Weight: 198 lb (89.812 kg)  SpO2: 96%      Physical Exam  Constitutional: He is oriented to person, place, and time. He appears well-developed and well-nourished.  Cardiovascular: Normal rate, normal heart sounds and intact distal pulses.   No murmur heard. Pulmonary/Chest: Effort normal and breath sounds normal. He has no wheezes. He has no rales. He exhibits no tenderness.  Abdominal: Soft. Bowel  sounds are normal. He exhibits no distension and no mass. There is no tenderness.  Musculoskeletal: Normal range of motion.  Neurological: He is alert and oriented to person, place, and time.  Skin:  Widespread erythematous rash on the trunk and lower extremities, no hives.         Assessment & Plan:  34 year old male recently commenced on allopurinol for gout with subsequent development of pruritic rash.  Allergic drug reaction: No evidence of respiratory compromise. Allergy to allopurinol. Placed on short course prednisone.  Gout: Placed on Indomethacine Colchicine for acute attacks.  This note has been created with Education officer, environmental. Any transcriptional errors are unintentional.

## 2015-02-01 NOTE — Progress Notes (Signed)
Patient has gout and took medication for gout and now has a rash throughout his body, used a cream (unsure of name) on the sites. Patient stopped taking gout med after 3 days. Rash has improved but is still present. Patient denies pain, only itching.

## 2015-08-23 IMAGING — CT CT ABD-PELV W/ CM
2 of 5 series · 14 of 46 positions shown, 16 images · IV contrast (Omni 300)
Comparison: None.

CLINICAL DATA: Generalized abdominal pain for 4 days with nausea,
vomiting, diarrhea, and fever.

EXAM:
CT ABDOMEN AND PELVIS WITH CONTRAST
TECHNIQUE: Multidetector CT imaging of the abdomen and pelvis was performed
using the standard protocol following bolus administration of
intravenous contrast.
CONTRAST:  100mL OMNIPAQUE IOHEXOL 300 MG/ML  SOLN

[Series 2: abd/ pelvis 5.0 i30f 1 · axial · 0.70mm/px · z∈[-924,-409]mm · 11 of 115 slices shown, 13 images]
[im 6/115  soft-tissue]
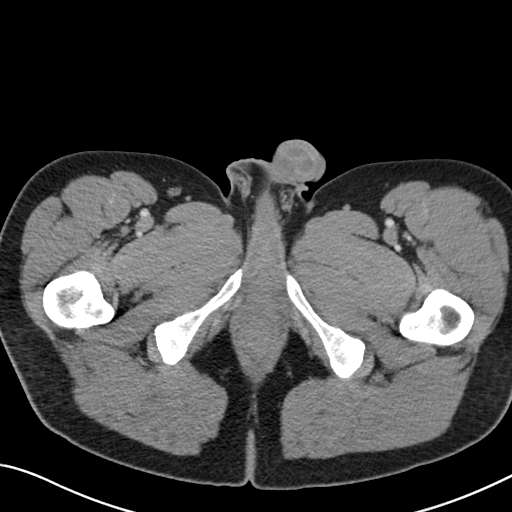
[im 6/115  bone]
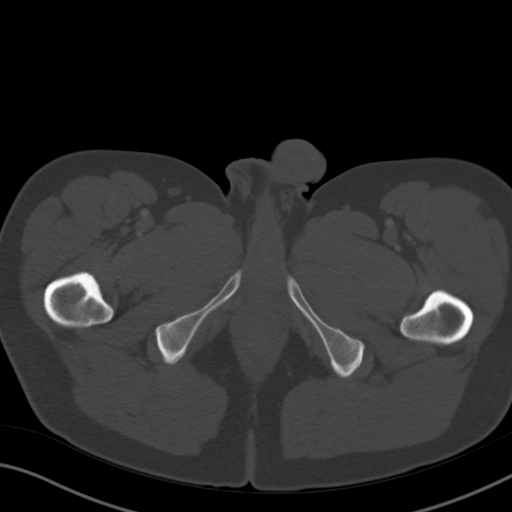
[im 17/115  soft-tissue]
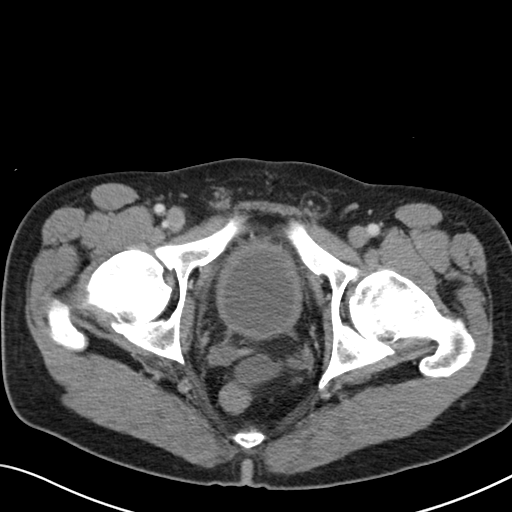
[im 28/115  soft-tissue]
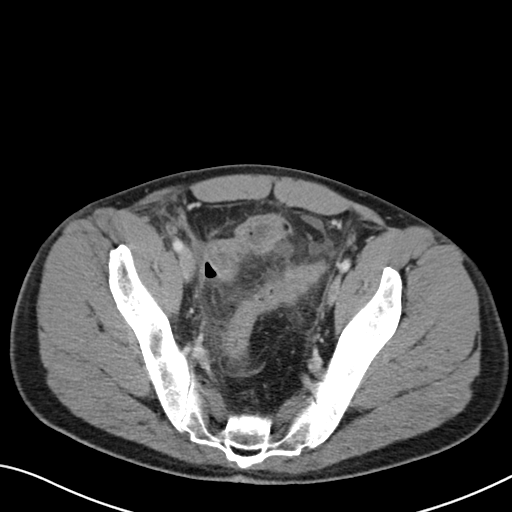
[im 39/115  soft-tissue]
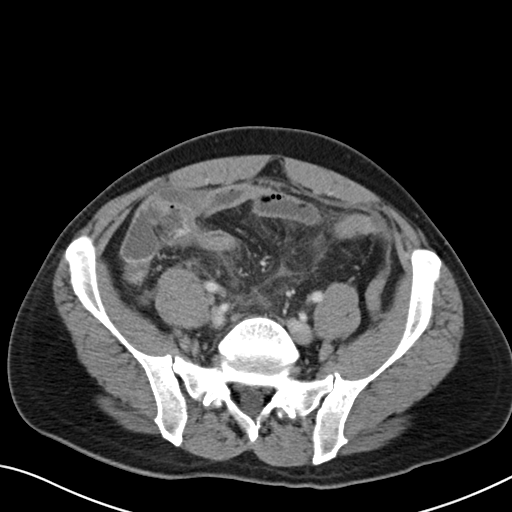
[im 49/115  soft-tissue]
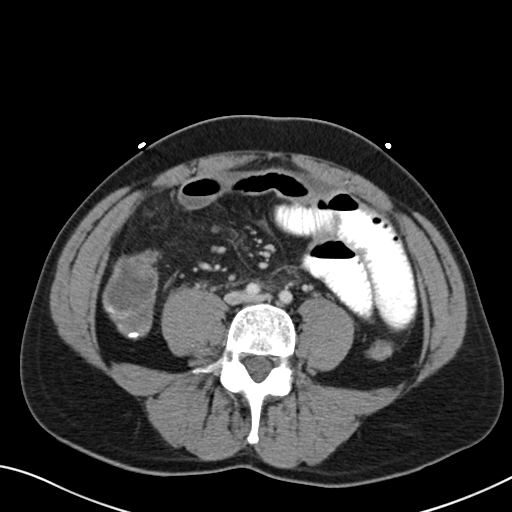
[im 60/115  soft-tissue]
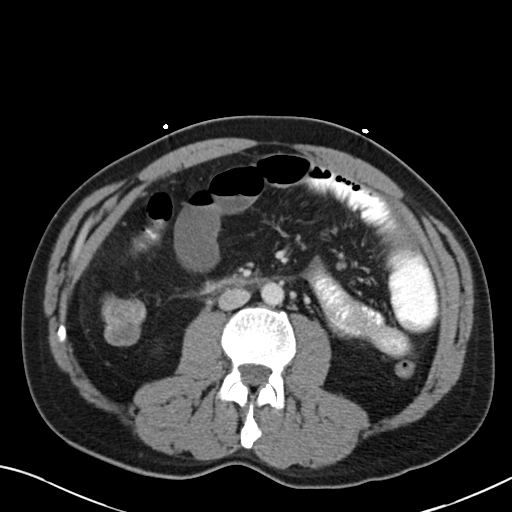
[im 66/115  soft-tissue]
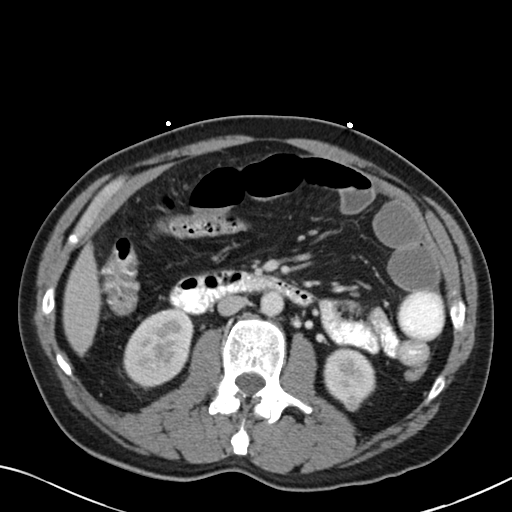
[im 77/115  soft-tissue]
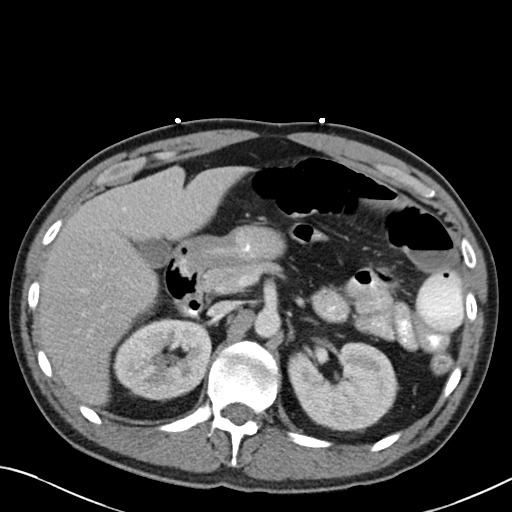
[im 87/115  soft-tissue]
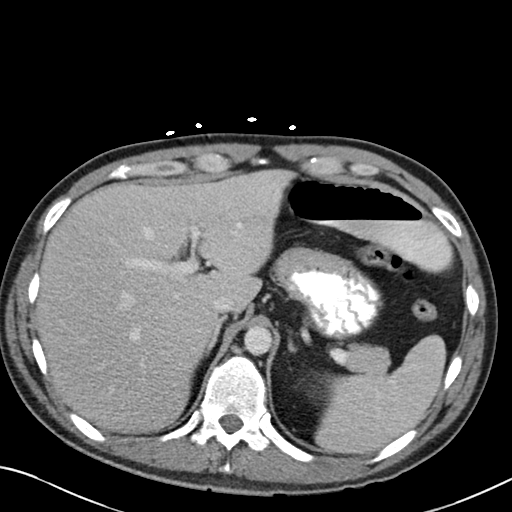
[im 87/115  bone]
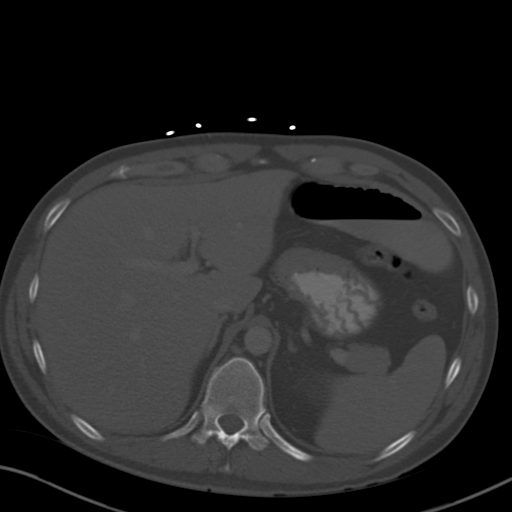
[im 98/115  soft-tissue]
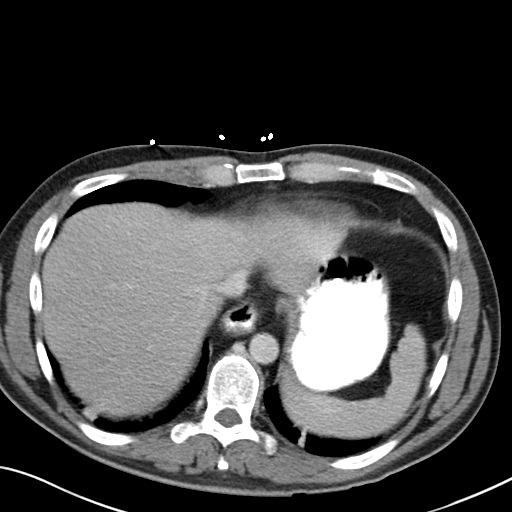
[im 109/115  soft-tissue]
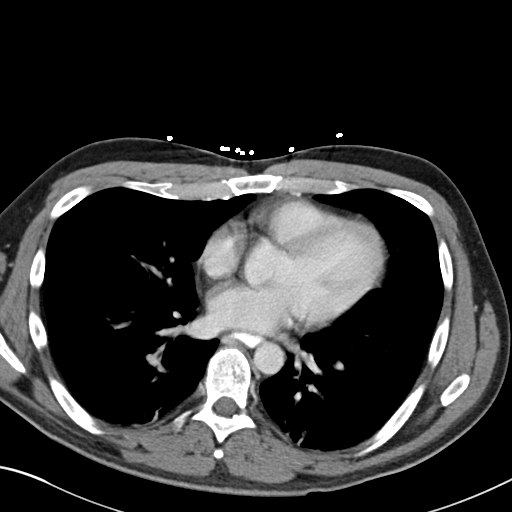

[Series 5: coronals · coronal · 0.65mm/px · 3 of 121 slices shown]
[im 41/121  soft-tissue]
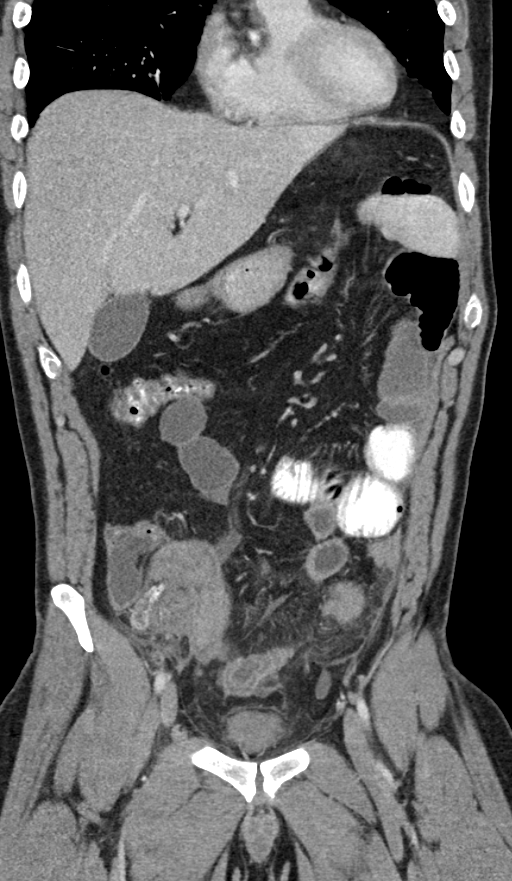
[im 54/121  soft-tissue]
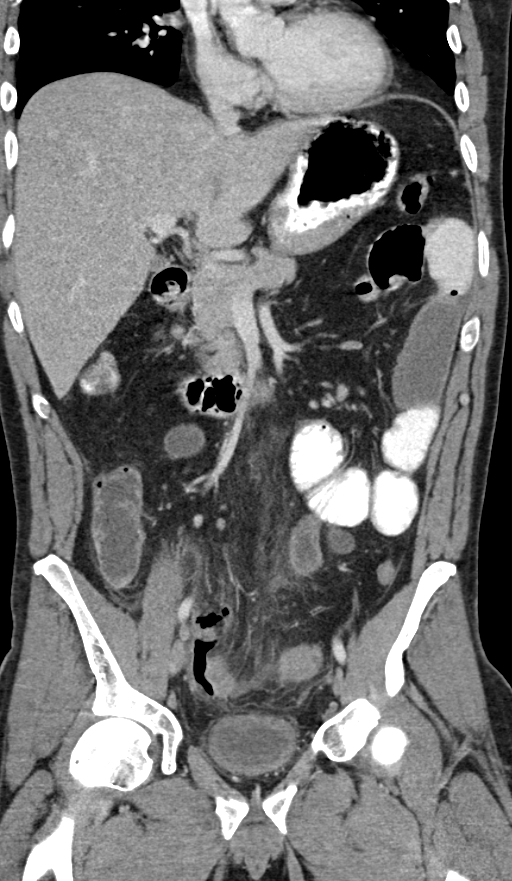
[im 67/121  soft-tissue]
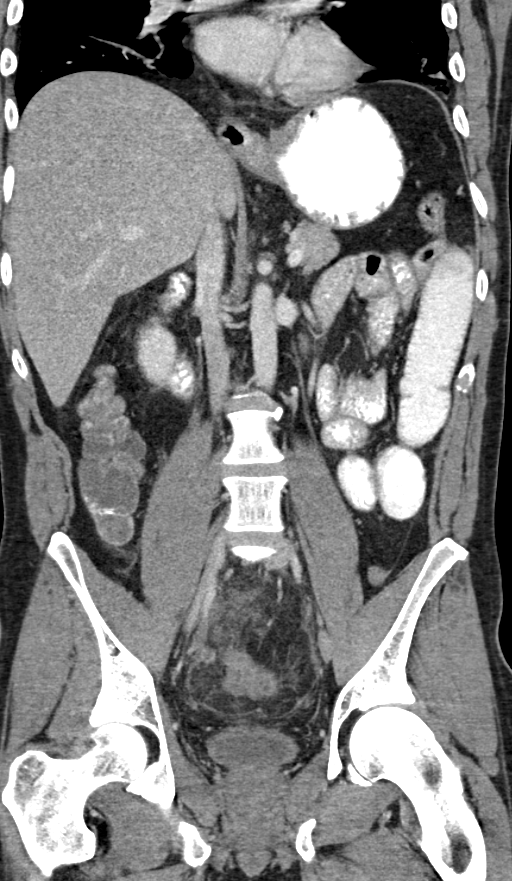

[14 of 46 positions shown; findings below may reference images not displayed]

FINDINGS: Motion artifact and subsegmental atelectasis are noted in the lung
bases. Oral contrast is present in the visualized distal thoracic
esophagus and may represent esophageal dysmotility or
gastroesophageal reflux.

No focal liver lesion is identified. The gallbladder, spleen,
adrenal glands, right kidney, and pancreas are unremarkable. There
is a 10 mm soft tissue density lesion projecting posteriorly from
the lower pole of the left kidney, too small to fully characterize.

There is prominent inflammatory stranding and a small amount of free
fluid in the right lower quadrant and pelvis. The appendix is
located centrally within this region of inflammation and is dilated,
measuring 12 mm in diameter, and demonstrates a hyperenhancing wall.
There is a small amount of intraperitoneal free air in the lower
abdomen and pelvis, the vast majority of which is along the superior
aspect of the appendix.

There is moderate wall thickening and hyper enhancement involving
the distal ileum with a 3.2 x 2.3 cm gas and fluid collection in the
pelvis abutting the inferior aspect of the distal ileum (series 2,
image 90 and series 6, image 85). A smaller loculated gas and fluid
collection in the anterior left pelvis measures up to 9 mm in
thickness and extends over a length of approximately 5 cm (series 2,
image 90). A loculated rim enhancing fluid collection more
posteriorly in the pelvis located between the bladder and rectum
measures 3.2 x 2.1 cm (series 2, image 98). There is mild dilatation
of multiple small bowel loops up to 3.6 cm in diameter. The colon is
decompressed. There is mild wall thickening of the sigmoid colon.

Small right mid and lower abdominal mesenteric lymph nodes measure
up to 6 mm in short axis and are likely reactive. Bladder is
nondistended and appears mildly thick-walled, likely related to
adjacent inflammation. No acute osseous abnormality is identified.
IMPRESSION: 1. Extensive inflammation in the right lower quadrant and pelvis
with small volume pneumoperitoneum. This is favored to represent
perforated appendicitis with secondary inflammatory changes
involving the distal ileum and sigmoid colon rather than a primary
small or large bowel process.
2. 3 small loculated fluid collections in the pelvis, concerning for
developing abscesses.
3. Mild small bowel dilatation likely reflecting ileus.
4. 10 mm indeterminate left renal lesion. Further evaluation with
abdominal MRI (without and with IV contrast) is recommended on an
outpatient basis after the patient's acute illness has resolved.
Critical Value/emergent results were called by telephone at the time
of interpretation on 12/12/2014 at [DATE] to Dr. YOEL TIGER , who
verbally acknowledged these results.

## 2016-02-27 ENCOUNTER — Ambulatory Visit: Payer: Self-pay | Admitting: Family Medicine

## 2016-03-01 ENCOUNTER — Ambulatory Visit (HOSPITAL_COMMUNITY)
Admission: EM | Admit: 2016-03-01 | Discharge: 2016-03-01 | Disposition: A | Discharge status: Home / Self Care | Payer: Self-pay | Attending: Internal Medicine | Admitting: Internal Medicine

## 2016-03-01 ENCOUNTER — Encounter (HOSPITAL_COMMUNITY): Payer: Self-pay | Admitting: Emergency Medicine

## 2016-03-01 DIAGNOSIS — M109 Gout, unspecified: Secondary | ICD-10-CM

## 2016-03-01 DIAGNOSIS — M10071 Idiopathic gout, right ankle and foot: Secondary | ICD-10-CM

## 2016-03-01 DIAGNOSIS — M7712 Lateral epicondylitis, left elbow: Secondary | ICD-10-CM

## 2016-03-01 DIAGNOSIS — M659 Synovitis and tenosynovitis, unspecified: Secondary | ICD-10-CM

## 2016-03-01 DIAGNOSIS — IMO0002 Reserved for concepts with insufficient information to code with codable children: Secondary | ICD-10-CM

## 2016-03-01 MED ORDER — INDOMETHACIN 50 MG PO CAPS: 50.0000 mg | ORAL_CAPSULE | Freq: Two times a day (BID) | ORAL | 0 refills | Status: AC

## 2016-03-01 MED ORDER — METHYLPREDNISOLONE ACETATE 80 MG/ML IJ SUSP
80.0000 mg | Freq: Once | INTRAMUSCULAR | Status: AC
  Administered 2016-03-01: 80 mg via INTRAMUSCULAR

## 2016-03-01 MED ORDER — COLCHICINE 0.6 MG PO TABS: ORAL_TABLET | ORAL | 0 refills | Status: AC

## 2016-03-01 MED ORDER — DEXAMETHASONE SODIUM PHOSPHATE 10 MG/ML IJ SOLN
INTRAMUSCULAR | Status: AC
  Filled 2016-03-01: qty 1

## 2016-03-01 MED ORDER — DEXAMETHASONE SODIUM PHOSPHATE 10 MG/ML IJ SOLN
10.0000 mg | Freq: Once | INTRAMUSCULAR | Status: AC
  Administered 2016-03-01: 10 mg via INTRAMUSCULAR

## 2016-03-01 MED ORDER — METHYLPREDNISOLONE ACETATE 80 MG/ML IJ SUSP
INTRAMUSCULAR | Status: AC
  Filled 2016-03-01: qty 1

## 2016-03-01 NOTE — Discharge Instructions (Signed)
Apply ice to the elbow off and on for the next few days as needed. Wear the wrist splint to minimize movement of the wrist. The medicines given to you are for inflammation due to gout and tendinitis. When taking the medications take with food to protect her stomach. Limit movement of your left hand and wrist as much as possible to allow the tendons and muscles to heal.

## 2016-03-01 NOTE — ED Provider Notes (Signed)
CSN: [id number]     Arrival date & time 03/01/16  1311 History   First MD Initiated Contact with Patient 03/01/16 1538     Chief Complaint  Patient presents with  . Gout   (Consider location/radiation/quality/duration/timing/severity/associated sxs/prior Treatment) [id number] male presents with complaints of gout to the left elbow and right ankle. He states he has a history of gout with similar presentation. The left elbow pain started approximate 5 days ago. He complains of swelling, tenderness and minor redness to the lateral aspect of the left elbow. It is worse with gripping, turning objects and working with his left hand. 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 He complains of a weak grip and pain with extension of the wrist that tends to originate in the elbow and radiates along the proximal forearm. Denies any known trauma or injury.  Second complaint is that of swelling, pain and tenderness to light palpation to the medial aspect of the right ankle. Denies any known injury. States he did not twist his ankle or injury. Insidious onset since chest today. He states that this is per atypical of previous gout episodes.      Past Medical History:  Diagnosis Date  . H/O pneumothorax    Past Surgical History:  Procedure Laterality Date  . APPENDECTOMY N/A 12/12/2014   Procedure: APPENDECTOMY;  Surgeon: Axel FillerArmando Ramirez, MD;  [facility];  Service: General;  Laterality: N/A;  . LAPAROSCOPY N/A 12/12/2014   Procedure: LAPAROSCOPY DIAGNOSTIC;  Surgeon: Axel FillerArmando Ramirez, MD;  Location: [facility] OR;  Service: General;  Laterality: N/A;  . LAPAROTOMY N/A 12/12/2014   Procedure: EXPLORATORY LAPAROTOMY;  Surgeon: Axel FillerArmando Ramirez, MD;  Location: [facility] OR;  Service: General;  Laterality: N/A;   Family History  Problem Relation Age of Onset  . Asthma Mother   . Heart disease Father    Social History  Substance Use Topics  . Smoking status: Never Smoker  . Smokeless tobacco: Never Used  . Alcohol use Yes      Comment: occasionally    Review of Systems  Constitutional: Negative.  Negative for fatigue and fever.  HENT: Negative.   Respiratory: Negative.   Gastrointestinal: Negative.   Genitourinary: Negative.   Musculoskeletal: Positive for arthralgias. Negative for gait problem.       As per HPI  Skin: Negative.   Neurological: Negative for dizziness, weakness, numbness and headaches.  All other systems reviewed and are negative.   Allergies  Allopurinol  Home Medications   Prior to Admission medications   Medication Sig Start Date End Date Taking? Authorizing Provider  colchicine 0.6 MG tablet Take 1 tablet now and one tablet one hour later. Then take 1 tablet daily for 5 days. 03/01/16   Hayden Rasmussenavid Ziare Cryder, NP  indomethacin (INDOCIN) 50 MG capsule Take 1 capsule (50 mg total) by mouth 2 (two) times daily with a meal. 03/01/16   Hayden Rasmussenavid Lesslie Mossa, NP  methocarbamol (ROBAXIN) 500 MG tablet Take 2 tablets (1,000 mg total) by mouth every 8 (eight) hours as needed for muscle spasms (or pain). 12/19/14   Nonie HoyerMegan N Baird, PA-C  omeprazole (PRILOSEC OTC) 20 MG tablet Take 40 mg by mouth daily.    Historical Provider, MD  oxyCODONE-acetaminophen (PERCOCET/ROXICET) 5-325 MG per tablet Take 1-2 tablets by mouth every 4 (four) hours as needed for moderate pain. 12/19/14   Nonie HoyerMegan N Baird, PA-C   Meds Ordered and Administered this Visit   Medications  dexamethasone (DECADRON) injection 10 mg (not administered)  methylPREDNISolone acetate (  DEPO-MEDROL) injection 80 mg (not administered)    BP 149/91 (BP Location: Right Arm)   Pulse 90   Temp 98.1 F (36.7 C) (Oral)   Resp 16   Ht 6\' 1"  (1.854 m)   Wt 210 lb (95.3 kg)   SpO2 100%   BMI 27.71 kg/m  No data found.   Physical Exam  Constitutional: He is oriented to person, place, and time. He appears well-developed and well-nourished. No distress.  HENT:  Head: Normocephalic and atraumatic.  Neck: Normal range of motion. Neck supple.  Cardiovascular:  Normal rate.   Pulmonary/Chest: Effort normal.  Musculoskeletal:  Left elbow with tenderness over the lateral epicondyles. Tenderness to the proximal forearm tendons. Left wrist extension particular he gives resistance produces moderate to severe pain in the left lateral elbow. Grip strength is weak and gripping also increases pain to the elbow. Limited extension due to elbow pain. There is no tenderness or pain to the olecranon.  Left ankle mildly swollen medially. No bony tenderness. No substantial erythema. Tenderness to light touch or palpation. Good range of motion of the ankle without joint pain. Distal neurovascular motor sensory is intact. Pedal pulses 2+.  Neurological: He is alert and oriented to person, place, and time.  Skin: Skin is warm and dry.  Nursing note and vitals reviewed.   Urgent Care Course   Clinical Course    Procedures (including critical care time)  Labs Review Labs Reviewed - No data to display  Imaging Review No results found.   Visual Acuity Review  Right Eye Distance:   Left Eye Distance:   Bilateral Distance:    Right Eye Near:   Left Eye Near:    Bilateral Near:         MDM   1. Lateral epicondylitis (tennis elbow), left   2. Acute gout of right ankle, unspecified cause   3. Tendinitis of elbow or forearm    Apply ice to the elbow off and on for the next few days as needed. Wear the wrist splint to minimize movement of the wrist. The medicines given to you are for inflammation due to gout and tendinitis. When taking the medications take with food to protect her stomach. Limit movement of your left hand and wrist as much as possible to allow the tendons and muscles to heal. Meds ordered this encounter  Medications  . dexamethasone (DECADRON) injection 10 mg  . methylPREDNISolone acetate (DEPO-MEDROL) injection 80 mg  . colchicine 0.6 MG tablet    Sig: Take 1 tablet now and one tablet one hour later. Then take 1 tablet daily for 5  days.    Dispense:  7 tablet    Refill:  0    Order Specific Question:   Supervising Provider    Answer:   Eustace Moore [[id number]]  . indomethacin (INDOCIN) 50 MG capsule    Sig: Take 1 capsule (50 mg total) by mouth 2 (two) times daily with a meal.    Dispense:  20 capsule    Refill:  0    Order Specific Question:   Supervising Provider    Answer:   Eustace Moore [[id number]]       Hayden Rasmussen, NP 03/01/16 [phone]

## 2016-03-01 NOTE — ED Triage Notes (Signed)
PT reports he has a history of gout. Tuesday, his left elbow became swollen and inflamed. Pt's right ankle is also bothering him.

## 2022-07-20 DIAGNOSIS — M129 Arthropathy, unspecified: Secondary | ICD-10-CM | POA: Diagnosis not present
